# Patient Record
Sex: Female | Born: 1990 | Race: White | Hispanic: Yes | Marital: Single | Smoking: Never smoker
Health system: Southern US, Community
[De-identification: ages and names within clinical notes are randomized; demographics above are authoritative.]

## PROBLEM LIST (undated history)

## (undated) DIAGNOSIS — D649 Anemia, unspecified: Secondary | ICD-10-CM

## (undated) DIAGNOSIS — R0600 Dyspnea, unspecified: Secondary | ICD-10-CM

## (undated) DIAGNOSIS — R519 Headache, unspecified: Secondary | ICD-10-CM

## (undated) DIAGNOSIS — Z87442 Personal history of urinary calculi: Secondary | ICD-10-CM

## (undated) DIAGNOSIS — K37 Unspecified appendicitis: Secondary | ICD-10-CM

## (undated) DIAGNOSIS — H9190 Unspecified hearing loss, unspecified ear: Secondary | ICD-10-CM

## (undated) DIAGNOSIS — R51 Headache: Secondary | ICD-10-CM

## (undated) DIAGNOSIS — K219 Gastro-esophageal reflux disease without esophagitis: Secondary | ICD-10-CM

## (undated) DIAGNOSIS — J988 Other specified respiratory disorders: Secondary | ICD-10-CM

## (undated) DIAGNOSIS — E669 Obesity, unspecified: Secondary | ICD-10-CM

## (undated) HISTORY — PX: TONSILLECTOMY: SUR1361

## (undated) HISTORY — PX: LUNG BIOPSY: SHX232

## (undated) HISTORY — PX: TRACHEOSTOMY: SUR1362

## (undated) HISTORY — PX: TONSILLECTOMY AND ADENOIDECTOMY: SUR1326

## (undated) HISTORY — DX: Other specified respiratory disorders: J98.8

## (undated) HISTORY — DX: Unspecified hearing loss, unspecified ear: H91.90

## (undated) HISTORY — PX: APPENDECTOMY: SHX54

## (undated) HISTORY — PX: CHOLECYSTECTOMY: SHX55

---
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
## 2015-02-25 DIAGNOSIS — J45909 Unspecified asthma, uncomplicated: Secondary | ICD-10-CM | POA: Insufficient documentation

## 2016-01-09 ENCOUNTER — Emergency Department (HOSPITAL_COMMUNITY)
Admission: EM | Admit: 2016-01-09 | Discharge: 2016-01-10 | Disposition: A | Discharge status: Home / Self Care | Payer: 59 | Attending: Emergency Medicine | Admitting: Emergency Medicine

## 2016-01-09 ENCOUNTER — Encounter (HOSPITAL_COMMUNITY): Payer: Self-pay | Admitting: Emergency Medicine

## 2016-01-09 DIAGNOSIS — N200 Calculus of kidney: Secondary | ICD-10-CM | POA: Diagnosis not present

## 2016-01-09 DIAGNOSIS — R109 Unspecified abdominal pain: Secondary | ICD-10-CM | POA: Diagnosis present

## 2016-01-09 HISTORY — DX: Obesity, unspecified: E66.9

## 2016-01-09 HISTORY — DX: Unspecified hearing loss, unspecified ear: H91.90

## 2016-01-09 HISTORY — DX: Anemia, unspecified: D64.9

## 2016-01-09 HISTORY — DX: Gastro-esophageal reflux disease without esophagitis: K21.9

## 2016-01-09 HISTORY — DX: Unspecified appendicitis: K37

## 2016-01-09 LAB — CBC
HCT: 31.8 % — ABNORMAL LOW (ref 36.0–46.0)
HEMOGLOBIN: 9.5 g/dL — AB (ref 12.0–15.0)
MCH: 17 pg — AB (ref 26.0–34.0)
MCHC: 29.9 g/dL — ABNORMAL LOW (ref 30.0–36.0)
MCV: 57 fL — AB (ref 78.0–100.0)
PLATELETS: 345 10*3/uL (ref 150–400)
RBC: 5.58 MIL/uL — AB (ref 3.87–5.11)
RDW: 21.7 % — ABNORMAL HIGH (ref 11.5–15.5)
WBC: 12.3 10*3/uL — ABNORMAL HIGH (ref 4.0–10.5)

## 2016-01-09 LAB — COMPREHENSIVE METABOLIC PANEL
ALBUMIN: 4.1 g/dL (ref 3.5–5.0)
ALT: 19 U/L (ref 14–54)
AST: 27 U/L (ref 15–41)
Alkaline Phosphatase: 83 U/L (ref 38–126)
Anion gap: 7 (ref 5–15)
BUN: 10 mg/dL (ref 6–20)
CHLORIDE: 110 mmol/L (ref 101–111)
CO2: 21 mmol/L — AB (ref 22–32)
CREATININE: 0.7 mg/dL (ref 0.44–1.00)
Calcium: 9.2 mg/dL (ref 8.9–10.3)
GFR calc non Af Amer: >60 mL/min (ref >60)
Glucose, Bld: 110 mg/dL — ABNORMAL HIGH (ref 65–99)
Potassium: 3.4 mmol/L — ABNORMAL LOW (ref 3.5–5.1)
SODIUM: 138 mmol/L (ref 135–145)
Total Bilirubin: 0.4 mg/dL (ref 0.3–1.2)
Total Protein: 7.8 g/dL (ref 6.5–8.1)

## 2016-01-09 LAB — URINE MICROSCOPIC-ADD ON

## 2016-01-09 LAB — URINALYSIS, ROUTINE W REFLEX MICROSCOPIC
GLUCOSE, UA: NEGATIVE mg/dL
Ketones, ur: 15 mg/dL — AB
Nitrite: NEGATIVE
Protein, ur: 100 mg/dL — AB
SPECIFIC GRAVITY, URINE: 1.035 — AB (ref 1.005–1.030)
pH: 5 (ref 5.0–8.0)

## 2016-01-09 LAB — LIPASE, BLOOD: LIPASE: 19 U/L (ref 11–51)

## 2016-01-09 LAB — I-STAT BETA HCG BLOOD, ED (MC, WL, AP ONLY): I-stat hCG, quantitative: <5 m[IU]/mL (ref <5)

## 2016-01-09 MED ORDER — ONDANSETRON HCL 4 MG/2ML IJ SOLN
4.0000 mg | Freq: Once | INTRAMUSCULAR | Status: AC
  Administered 2016-01-09: 4 mg via INTRAVENOUS
  Filled 2016-01-09: qty 2

## 2016-01-09 MED ORDER — SODIUM CHLORIDE 0.9 % IV BOLUS (SEPSIS)
1000.0000 mL | Freq: Once | INTRAVENOUS | Status: AC
  Administered 2016-01-09: 1000 mL via INTRAVENOUS

## 2016-01-09 MED ORDER — KETOROLAC TROMETHAMINE 30 MG/ML IJ SOLN
30.0000 mg | Freq: Once | INTRAMUSCULAR | Status: AC
  Administered 2016-01-09: 30 mg via INTRAVENOUS
  Filled 2016-01-09: qty 1

## 2016-01-09 NOTE — ED Provider Notes (Signed)
[facility] DEPT Provider Note   CSN: [id number] Arrival date & time: 01/09/16  1905     History   Chief Complaint Chief Complaint  Patient presents with  . Abdominal Pain    HPI Karen Mcdaniel is a 25 y.o. female presenting with right-sided abdominal pain. Started around noon today. Is a sharp, constant pain. No back pain. No radiation of the pain. No urinary symptoms. Has vomited 3 or 4 times and had 4 loose bowel movements. No blood in either. She's not sure if the bowel movements are diarrhea or not. No fevers during this time. Took Tylenol but vomited back up. No vaginal bleeding or discharge.  HPI  Past Medical History:  Diagnosis Date  . Anemia   . Appendicitis   . GERD (gastroesophageal reflux disease)   . Hard of hearing   . Obesity     There are no active problems to display for this patient.   Past Surgical History:  Procedure Laterality Date  . APPENDECTOMY    . CHOLECYSTECTOMY    . LUNG BIOPSY    . TONSILLECTOMY      OB History    No data available       Home Medications    Prior to Admission medications   Medication Sig Start Date End Date Taking? Authorizing Provider  Budesonide (PULMICORT FLEXHALER) 90 MCG/ACT inhaler Inhale 1 puff into the lungs 2 (two) times daily as needed (shortness of breath).   Yes Historical Provider, MD  docusate sodium (COLACE) 100 MG capsule Take 100 mg by mouth 2 (two) times daily.   Yes Historical Provider, MD  ferrous sulfate 325 (65 FE) MG tablet Take 325 mg by mouth 2 (two) times daily with a meal.   Yes Historical Provider, MD  omeprazole (PRILOSEC) 40 MG capsule Take 40 mg by mouth 2 (two) times daily.   Yes Historical Provider, MD    Family History No family history on file.  Social History Social History  Substance Use Topics  . Smoking status: Never Smoker  . Smokeless tobacco: Never Used  . Alcohol use No     Allergies   Review of patient's allergies indicates no known allergies.   Review  of Systems Review of Systems  Constitutional: Negative for fever.  Gastrointestinal: Positive for abdominal pain, diarrhea, nausea and vomiting. Negative for blood in stool.  Genitourinary: Negative for dysuria, hematuria, menstrual problem, vaginal bleeding and vaginal discharge.  Musculoskeletal: Negative for back pain.  All other systems reviewed and are negative.    Physical Exam Updated Vital Signs BP 124/78   Pulse 89   Temp 98.2 F (36.8 C) (Oral)   Resp 18   Ht 4\' 10"  (1.473 m)   Wt 182 lb (82.6 kg)   LMP 12/15/2015 (Approximate)   SpO2 100%   BMI 38.04 kg/m   Physical Exam  Constitutional: She is oriented to person, place, and time. She appears well-developed and well-nourished.  HENT:  Head: Normocephalic and atraumatic.  Right Ear: External ear normal.  Left Ear: External ear normal.  Nose: Nose normal.  Eyes: Right eye exhibits no discharge. Left eye exhibits no discharge.  Cardiovascular: Normal rate, regular rhythm and normal heart sounds.   Pulmonary/Chest: Effort normal and breath sounds normal.  Abdominal: Soft. There is no tenderness. There is no CVA tenderness.  Neurological: She is alert and oriented to person, place, and time.  Skin: Skin is warm and dry.  Nursing note and vitals reviewed.    ED Treatments /  Results  Labs (all labs ordered are listed, but only abnormal results are displayed) Labs Reviewed  COMPREHENSIVE METABOLIC PANEL - Abnormal; Notable for the following:       Result Value   Potassium 3.4 (*)    CO2 21 (*)    Glucose, Bld 110 (*)    All other components within normal limits  CBC - Abnormal; Notable for the following:    WBC 12.3 (*)    RBC 5.58 (*)    Hemoglobin 9.5 (*)    HCT 31.8 (*)    MCV 57.0 (*)    MCH 17.0 (*)    MCHC 29.9 (*)    RDW 21.7 (*)    All other components within normal limits  URINALYSIS, ROUTINE W REFLEX MICROSCOPIC (NOT AT [facility]) - Abnormal; Notable for the following:    Color, Urine AMBER (*)      APPearance TURBID (*)    Specific Gravity, Urine 1.035 (*)    Hgb urine dipstick LARGE (*)    Bilirubin Urine MODERATE (*)    Ketones, ur 15 (*)    Protein, ur 100 (*)    Leukocytes, UA SMALL (*)    All other components within normal limits  URINE MICROSCOPIC-ADD ON - Abnormal; Notable for the following:    Squamous Epithelial / LPF 6-30 (*)    Bacteria, UA FEW (*)    Crystals CA OXALATE CRYSTALS (*)    All other components within normal limits  LIPASE, BLOOD  I-STAT BETA HCG BLOOD, ED ([facility], [facility], [facility] ONLY)    EKG  EKG Interpretation None       Radiology No results found.  Procedures Procedures (including critical care time)  Medications Ordered in ED Medications  sodium chloride 0.9 % bolus 1,000 mL (1,000 mLs Intravenous New Bag/Given 01/09/16 2231)  ketorolac (TORADOL) 30 MG/ML injection 30 mg (30 mg Intravenous Given 01/09/16 2233)  ondansetron (ZOFRAN) injection 4 mg (4 mg Intravenous Given 01/09/16 2232)     Initial Impression / Assessment and Plan / ED Course  I have reviewed the triage vital signs and the nursing notes.  Pertinent labs & imaging results that were available during my care of the patient were reviewed by me and considered in my medical decision making (see chart for details).  Clinical Course  Comment By Time  No abd tenderness. Will get CT stone. No appendix Pricilla LovelessScott Danyelle Brookover, MD 09/04 2221    Mostly likely a stone. CT still pending, care transferred to Dr. Elesa MassedWard.  Final Clinical Impressions(s) / ED Diagnoses   Final diagnoses:  None    New Prescriptions New Prescriptions   No medications on file     Pricilla LovelessScott Jahquez Steffler, MD 01/10/16 [phone]

## 2016-01-09 NOTE — ED Triage Notes (Signed)
Pt. reports right abdominal pain with multiple emesis onset today , no emesis at arrival , denies diarrhea or fever.

## 2016-01-10 ENCOUNTER — Encounter (HOSPITAL_COMMUNITY): Payer: Self-pay | Admitting: Radiology

## 2016-01-10 ENCOUNTER — Emergency Department (HOSPITAL_COMMUNITY): Payer: 59

## 2016-01-10 MED ORDER — ONDANSETRON 4 MG PO TBDP: 4.0000 mg | ORAL_TABLET | Freq: Three times a day (TID) | ORAL | 0 refills | Status: DC | PRN

## 2016-01-10 MED ORDER — FENTANYL CITRATE (PF) 100 MCG/2ML IJ SOLN
50.0000 ug | Freq: Once | INTRAMUSCULAR | Status: AC
  Administered 2016-01-10: 50 ug via INTRAVENOUS
  Filled 2016-01-10: qty 2

## 2016-01-10 MED ORDER — IBUPROFEN 800 MG PO TABS: 800.0000 mg | ORAL_TABLET | Freq: Three times a day (TID) | ORAL | 0 refills | Status: DC | PRN

## 2016-01-10 MED ORDER — ONDANSETRON HCL 4 MG/2ML IJ SOLN
4.0000 mg | Freq: Once | INTRAMUSCULAR | Status: AC
  Administered 2016-01-10: 4 mg via INTRAVENOUS
  Filled 2016-01-10: qty 2

## 2016-01-10 MED ORDER — TAMSULOSIN HCL 0.4 MG PO CAPS: 0.4000 mg | ORAL_CAPSULE | Freq: Every day | ORAL | 0 refills | Status: DC

## 2016-01-10 MED ORDER — OXYCODONE-ACETAMINOPHEN 5-325 MG PO TABS: 1.0000 | ORAL_TABLET | Freq: Four times a day (QID) | ORAL | 0 refills | Status: DC | PRN

## 2016-01-10 NOTE — ED Notes (Signed)
Pt stable, ambulatory, states understanding of discharge instructions 

## 2016-01-10 NOTE — ED Provider Notes (Signed)
2:00 AM  Assumed care from Dr. Criss AlvineGoldston.  Pt is a 25 y.o. female who presented to the emergency department right-sided abdominal pain, vomiting. Urine showed blood, calcium oxide crystals. CT of her abdomen and pelvis to rule out stone pending. CT is now back and she has a 3 mm stone at the [facility] without significant hydronephrosis. Urine shows no sign of infection. Creatinine is normal. Pain is improved with Toradol. She still having some mild pain. We'll give fentanyl, Zofran. We'll discharge with urine strainer. She states she is from [place] and plans to go back next week. Have recommended she follow-up with urologist in [place]. We'll discharge with Percocet, ibuprofen, Zofran, Flomax. Discussed return precautions. She is comfortable with this plan.   Layla MawKristen N Amesha Bailey, DO 01/10/16 0221

## 2016-05-03 ENCOUNTER — Emergency Department (HOSPITAL_COMMUNITY): Payer: Managed Care, Other (non HMO)

## 2016-05-03 ENCOUNTER — Emergency Department (HOSPITAL_COMMUNITY)
Admission: EM | Admit: 2016-05-03 | Discharge: 2016-05-03 | Disposition: A | Discharge status: Home / Self Care | Payer: Managed Care, Other (non HMO) | Attending: Emergency Medicine | Admitting: Emergency Medicine

## 2016-05-03 ENCOUNTER — Encounter (HOSPITAL_COMMUNITY): Payer: Self-pay

## 2016-05-03 DIAGNOSIS — Z79899 Other long term (current) drug therapy: Secondary | ICD-10-CM | POA: Insufficient documentation

## 2016-05-03 DIAGNOSIS — J386 Stenosis of larynx: Secondary | ICD-10-CM | POA: Diagnosis not present

## 2016-05-03 DIAGNOSIS — R061 Stridor: Secondary | ICD-10-CM

## 2016-05-03 DIAGNOSIS — R0602 Shortness of breath: Secondary | ICD-10-CM | POA: Diagnosis present

## 2016-05-03 LAB — CBC
HCT: 28.9 % — ABNORMAL LOW (ref 36.0–46.0)
Hemoglobin: 8.6 g/dL — ABNORMAL LOW (ref 12.0–15.0)
MCH: 16.1 pg — ABNORMAL LOW (ref 26.0–34.0)
MCHC: 29.8 g/dL — AB (ref 30.0–36.0)
MCV: 54.2 fL — ABNORMAL LOW (ref 78.0–100.0)
PLATELETS: 221 10*3/uL (ref 150–400)
RBC: 5.33 MIL/uL — ABNORMAL HIGH (ref 3.87–5.11)
RDW: 20.3 % — AB (ref 11.5–15.5)
WBC: 7.6 10*3/uL (ref 4.0–10.5)

## 2016-05-03 LAB — BASIC METABOLIC PANEL
ANION GAP: 7 (ref 5–15)
BUN: 8 mg/dL (ref 6–20)
CHLORIDE: 107 mmol/L (ref 101–111)
CO2: 21 mmol/L — AB (ref 22–32)
CREATININE: 0.6 mg/dL (ref 0.44–1.00)
Calcium: 8.9 mg/dL (ref 8.9–10.3)
GFR calc non Af Amer: >60 mL/min (ref >60)
Glucose, Bld: 90 mg/dL (ref 65–99)
POTASSIUM: 3.6 mmol/L (ref 3.5–5.1)
Sodium: 135 mmol/L (ref 135–145)

## 2016-05-03 LAB — I-STAT TROPONIN, ED: TROPONIN I, POC: 0 ng/mL (ref 0.00–0.08)

## 2016-05-03 MED ORDER — DEXAMETHASONE SODIUM PHOSPHATE 10 MG/ML IJ SOLN
10.0000 mg | Freq: Once | INTRAMUSCULAR | Status: AC
  Administered 2016-05-03: 10 mg via INTRAVENOUS
  Filled 2016-05-03: qty 1

## 2016-05-03 MED ORDER — RACEPINEPHRINE HCL 2.25 % IN NEBU
0.5000 mL | INHALATION_SOLUTION | Freq: Once | RESPIRATORY_TRACT | Status: AC
  Administered 2016-05-03: 0.5 mL via RESPIRATORY_TRACT
  Filled 2016-05-03: qty 0.5

## 2016-05-03 MED ORDER — ALBUTEROL SULFATE (2.5 MG/3ML) 0.083% IN NEBU
INHALATION_SOLUTION | RESPIRATORY_TRACT | Status: AC
  Filled 2016-05-03: qty 3

## 2016-05-03 MED ORDER — ALBUTEROL SULFATE (2.5 MG/3ML) 0.083% IN NEBU
5.0000 mg | INHALATION_SOLUTION | Freq: Once | RESPIRATORY_TRACT | Status: AC
  Administered 2016-05-03: 5 mg via RESPIRATORY_TRACT
  Administered 2016-05-03: 2.5 mg via RESPIRATORY_TRACT

## 2016-05-03 MED ORDER — PREDNISONE 20 MG PO TABS: ORAL_TABLET | ORAL | 0 refills | Status: DC

## 2016-05-03 MED ORDER — ALBUTEROL SULFATE (2.5 MG/3ML) 0.083% IN NEBU: 5.0000 mg | INHALATION_SOLUTION | Freq: Once | RESPIRATORY_TRACT | Status: DC

## 2016-05-03 MED ORDER — ALBUTEROL SULFATE (2.5 MG/3ML) 0.083% IN NEBU
INHALATION_SOLUTION | RESPIRATORY_TRACT | Status: AC
  Administered 2016-05-03: 2.5 mg via RESPIRATORY_TRACT
  Filled 2016-05-03: qty 6

## 2016-05-03 NOTE — ED Notes (Signed)
Pt with audible wheeze, stats 100%

## 2016-05-03 NOTE — ED Triage Notes (Signed)
Pt states that for the past three days she has been having SOB, pt with audible wheezing, states she has had to have her airway dilated twice. Also having CP on the L side

## 2016-05-03 NOTE — ED Provider Notes (Signed)
[facility] Provider Note   CSN: [id number] Arrival date & time: 05/03/16  0330     History   Chief Complaint Chief Complaint  Patient presents with  . Shortness of Breath  . Chest Pain    HPI Karen Mcdaniel is a 25 y.o. female.  Patient presents to the emergency part with complaints of shortness of breath. Patient has a long history of laryngeal stenosis, previously treated in [place] by an ENT specialist. Patient reports intermittent shortness of breath, loss of voice requiring intervention. 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 Patient reports that for the last 3 days she has had progressively worsening shortness of breath similar to previous problems.      Past Medical History:  Diagnosis Date  . Anemia   . Appendicitis   . GERD (gastroesophageal reflux disease)   . Hard of hearing   . Obesity     There are no active problems to display for this patient.   Past Surgical History:  Procedure Laterality Date  . APPENDECTOMY    . CHOLECYSTECTOMY    . LUNG BIOPSY    . TONSILLECTOMY      OB History    No data available       Home Medications    Prior to Admission medications   Medication Sig Start Date End Date Taking? Authorizing Provider  Budesonide (PULMICORT FLEXHALER) 90 MCG/ACT inhaler Inhale 1 puff into the lungs 2 (two) times daily as needed (shortness of breath).   Yes Historical Provider, MD  docusate sodium (COLACE) 100 MG capsule Take 100 mg by mouth 2 (two) times daily.   Yes Historical Provider, MD  ferrous sulfate 325 (65 FE) MG tablet Take 325 mg by mouth 2 (two) times daily with a meal.   Yes Historical Provider, MD  omeprazole (PRILOSEC) 40 MG capsule Take 40 mg by mouth 2 (two) times daily.   Yes Historical Provider, MD  ibuprofen (ADVIL,MOTRIN) 800 MG tablet Take 1 tablet (800 mg total) by mouth every 8 (eight) hours as needed for mild pain. Patient not taking:  Reported on 05/03/2016 01/10/16   Kristen N Ward, DO  ondansetron (ZOFRAN ODT) 4 MG disintegrating tablet Take 1 tablet (4 mg total) by mouth every 8 (eight) hours as needed for nausea or vomiting. Patient not taking: Reported on 05/03/2016 01/10/16   Layla MawKristen N Ward, DO  oxyCODONE-acetaminophen (PERCOCET/ROXICET) 5-325 MG tablet Take 1-2 tablets by mouth every 6 (six) hours as needed. Patient not taking: Reported on 05/03/2016 01/10/16   Layla MawKristen N Ward, DO  tamsulosin (FLOMAX) 0.4 MG CAPS capsule Take 1 capsule (0.4 mg total) by mouth daily. Take until kidney stone has passed Patient not taking: Reported on 05/03/2016 01/10/16   Layla MawKristen N Ward, DO    Family History No family history on file.  Social History Social History  Substance Use Topics  . Smoking status: Never Smoker  . Smokeless tobacco: Never Used  . Alcohol use No     Allergies   Patient has no known allergies.   Review of Systems Review of Systems  HENT: Positive for voice change.   Respiratory: Positive for shortness of breath.   All other systems reviewed and are negative.    Physical Exam Updated Vital Signs BP 121/83 (BP Location: Right Arm)   Pulse 120   Temp 98.2 F (36.8 C) (Oral)   Resp 14   Ht 4\' 10"  (1.473 m)  Wt 183 lb (83 kg)   LMP 05/02/2016   SpO2 99%   BMI 38.25 kg/m   Physical Exam  Constitutional: She is oriented to person, place, and time. She appears well-developed and well-nourished. No distress.  HENT:  Head: Normocephalic and atraumatic.  Right Ear: Hearing normal.  Left Ear: Hearing normal.  Nose: Nose normal.  Mouth/Throat: Oropharynx is clear and moist and mucous membranes are normal.  Eyes: Conjunctivae and EOM are normal. Pupils are equal, round, and reactive to light.  Neck: Normal range of motion. Neck supple.  Cardiovascular: Regular rhythm, S1 normal and S2 normal.  Exam reveals no gallop and no friction rub.   No murmur heard. Pulmonary/Chest: Effort normal and breath  sounds normal. Stridor present. No respiratory distress. She exhibits no tenderness.  Abdominal: Soft. Normal appearance and bowel sounds are normal. There is no hepatosplenomegaly. There is no tenderness. There is no rebound, no guarding, no tenderness at McBurney's point and negative Murphy's sign. No hernia.  Musculoskeletal: Normal range of motion.  Neurological: She is alert and oriented to person, place, and time. She has normal strength. No cranial nerve deficit or sensory deficit. Coordination normal. GCS eye subscore is 4. GCS verbal subscore is 5. GCS motor subscore is 6.  Skin: Skin is warm, dry and intact. No rash noted. No cyanosis.  Psychiatric: She has a normal mood and affect. Her speech is normal and behavior is normal. Thought content normal.  Nursing note and vitals reviewed.    ED Treatments / Results  Labs (all labs ordered are listed, but only abnormal results are displayed) Labs Reviewed  BASIC METABOLIC PANEL - Abnormal; Notable for the following:       Result Value   CO2 21 (*)    All other components within normal limits  CBC - Abnormal; Notable for the following:    RBC 5.33 (*)    Hemoglobin 8.6 (*)    HCT 28.9 (*)    MCV 54.2 (*)    MCH 16.1 (*)    MCHC 29.8 (*)    RDW 20.3 (*)    All other components within normal limits  I-STAT TROPOININ, ED    EKG  EKG Interpretation None       Radiology Dg Neck Soft Tissue  Result Date: 05/03/2016 CLINICAL DATA:  Cough, shortness of breath and stridor EXAM: NECK SOFT TISSUES - 1+ VIEW COMPARISON:  None. FINDINGS: There is loss of the normal epiglottic silhouette, which may be secondary to enlargement of the lingual tonsils. There is no airway compromise. No prevertebral soft tissue swelling. IMPRESSION: No airway compromise. Loss of normal epiglottic silhouette is favored to be due to enlarged lingual tonsils, however, epiglottic edema would be difficult to exclude. CT could be performed for more definitive  characterization if clinically indicated. Electronically Signed   By: Deatra Robinson M.D.   On: 05/03/2016 05:58   Dg Chest 2 View  Result Date: 05/03/2016 CLINICAL DATA:  Cough and shortness of breath EXAM: CHEST  2 VIEW COMPARISON:  CT abdomen pelvis 01/10/2016 FINDINGS: There is suture material in the right lung base, reflecting prior biopsy. No focal airspace consolidation or pulmonary edema. Cardiomediastinal contours are normal. No pleural effusion or pneumothorax. IMPRESSION: 1. No active cardiopulmonary disease. 2. Scarring and suture material in the right middle lobe at site of prior biopsy. Electronically Signed   By: Deatra Robinson M.D.   On: 05/03/2016 05:56    Procedures Procedures (including critical care time)  Medications Ordered  in ED Medications  albuterol (PROVENTIL) (2.5 MG/3ML) 0.083% nebulizer solution 5 mg (not administered)  albuterol (PROVENTIL) (2.5 MG/3ML) 0.083% nebulizer solution 5 mg (2.5 mg Nebulization Given 05/03/16 0409)  dexamethasone (DECADRON) injection 10 mg (10 mg Intravenous Given 05/03/16 0426)  Racepinephrine HCl 2.25 % nebulizer solution 0.5 mL (0.5 mLs Nebulization Given 05/03/16 0426)     Initial Impression / Assessment and Plan / ED Course  I have reviewed the triage vital signs and the nursing notes.  Pertinent labs & imaging results that were available during my care of the patient were reviewed by me and considered in my medical decision making (see chart for details).  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 She reports having dilatation in December of last year that did not improve her symptoms of voice loss and shortness of breath. She had a repeat procedure in April which included CO2 laser removal of a colonic lab which improved her significantly. She now reports, however, in the last 3 days she has had increased worse voice and progressively worsening  shortness of breath.  Arrival, patient was exhibiting stridor and some increased work of breathing. This has resolved after racemic epinephrine and Decadron. She has been monitored for 2 hours and continues to do well without any recurrence of stridor. Discussed briefly with Dr. Lazarus SalinesWolicki. He can see the patient in the office at 9 AM. Patient continues to do well and is stable for discharge and outpatient follow-up this morning.  Final Clinical Impressions(s) / ED Diagnoses   Final diagnoses:  Stridor  Laryngeal stenosis    New Prescriptions New Prescriptions   No medications on file     Gilda Creasehristopher J Pollina, MD 05/03/16 [phone]

## 2016-05-03 NOTE — Discharge Instructions (Signed)
Go to Dr. Raye SorrowWolicki's office at [facility] for repeat evaluation and further treatment. Call 911 for any recurrent episodes of shortness of breath.

## 2016-06-26 ENCOUNTER — Ambulatory Visit: Payer: Self-pay | Admitting: Otolaryngology

## 2016-06-26 NOTE — H&P (Signed)
HPI:   Karen Mcdaniel is a 26 y.o. female patient of Patient Does Not Have Pcp for evaluation of Hoarseness and breathing difficulty. 26 year old Hispanic female recently moved down from the [place] area. She was diagnosed with some sort of stenosis at the age of 55. She was seen at [facility] at the age of 39 but no therapy at that time. By the time she was 19, received dilation. She had a possible dilatation in December 2016 which helped very little. She had [employer] laryngoscopy with laser lysis of anterior glottic stenosis in May 2017. She was improved for a while but has been having worsening hoarseness for the past 3-4 months, and breathing difficulty for the last 3 or 4 days. She may have an upper respiratory infection but no facial pain or nasal drainage. I reviewed the operative report from May 2017 describing a two thirds fusion of the anterior vocal cords. Upon releasing the fusion, no subglottic or tracheal stenosis was noted. She was seen in the emergency room this morning and was improved after a course of racemic epinephrine, and Decadron. She is here for further consideration. No swallowing issues. Over the years, she has had ear surgery, gallbladder removal, appendectomy, open lung biopsy all apparently under successful general anesthesia. She has been on Prilosec once or twice daily for years.  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 PMH/Meds/All/SocHx/FamHx/ROS:   Past Medical History:  Diagnosis Date  . Acid reflux  . Hearing loss   Past Surgical History:  Procedure Laterality Date  . GALLBLADDER SURGERY   No family history of bleeding disorders, wound healing problems or difficulty with anesthesia.   Social History   Social History  . Marital status: Single  Spouse name: N/A  . Number of children: N/A  . Years of education: N/A   Occupational History  . Not on file.    Social History Main Topics  . Smoking status: Never Smoker  . Smokeless tobacco: Never Used  . Alcohol use Yes  . Drug use: Unknown  . Sexual activity: Not on file   Other Topics Concern  . Not on file   Social History Narrative  . No narrative on file   Current Outpatient Prescriptions:  . BUDESONIDE (PULMICORT INHL), Inhale into the lungs., Disp: , Rfl:  . docusate sodium (COLACE) 100 MG capsule, Take 100 mg by mouth 2 times daily., Disp: , Rfl:  . FERROUS SULFATE (IRON ORAL), Take by mouth., Disp: , Rfl:  . omeprazole (PRILOSEC) 40 MG capsule, Take 40 mg by mouth daily., Disp: , Rfl:  . amoxicillin (AMOXIL) 500 MG capsule, Take 1 capsule (500 mg total) by mouth 3 times daily., Disp: 30 capsule, Rfl: 0  A complete ROS was performed with pertinent positives/negatives noted in the HPI. The remainder of the ROS are negative.   Physical Exam:   Ht 1.473 m (4\' 10" )  Wt 83 kg (183 lb)  BMI 38.25 kg/m  She is petite. Her voice is aphonic. She has some inspiratory stridor. Mental status is appropriate. She hears reasonably in conversational speech with a hearing aid on the right side. The head is atraumatic and neck supple. Cranial nerves intact. Ear canals are clear. Both drums are scarred and retracted with possible fluid. Anterior nose shows a mild rightward septal deviation with healthy mucosa. Oral cavity is clear with teeth in good repair. Oropharynx clear. Neck without adenopathy. No thyromegaly. I could not get an  adequate look at the larynx with a mirror examination.  Using the flexible laryngoscope, she has a strong gag reflex. There is some greenish mucoid material in both sides of the nose and the nasopharynx. On brief inspection of the larynx, the vocal cords appear to be fused approximately halfway from the anterior commissure back. The airway looks okay. There is some posterior laryngeal erythema and swelling.  Flexible Laryngoscopy  Indications were discussed. Details  of the procedure were explained. Questions were answered and informed consent was obtained verbally.   Technique: After anesthetizing the nasal cavity with topical lidocaine and oxymetazoline, the flexible endoscope was introduced and passed through the right nasal cavity into the nasopharynx. The nasopharynx was inspected with normal eustachian tori. Oropharynx normal with minimal lingual tonsils and no lesions. Hypopharynx/Larynx shows mobile vocal cords with good airway. No pooling in valleculae or pyriforms. The scope was withdrawn from the nose. She tolerated the procedure well.   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 Recent upper respiratory infection/possible sinusitis as an acute insult causing deterioration.  Plan: I gave her nasal hygiene instructions and a prescription for amoxicillin. I will see her back in 2 weeks. She may once again need lysis of the anterior cords for more definitive treatment, she may need a laryngotomy with placement of a keel which would likely require a tracheostomy.

## 2016-06-28 NOTE — Pre-Procedure Instructions (Signed)
Ryenne Mowrer  06/28/2016      [id number]- [place], [place] - [address] [facility] [address] [facility] [address] [id number] [place] [id number] Phone: [phone] Fax: [phone]  [facility] [address] - [place], [place] - [id number] [place] [address] Phone: [phone] Fax: [phone]    Your procedure is scheduled on Wednesday, February 28.  [facility] Ad[facility] at 8:30 AM               Your surgery or procedure is scheduled for 10:30 AM   Call this number if you have problems the morning of surgery:[phone]       For any other questions, please call [phone], Monday - Friday 8 AM - 4 PM.   Remember:  Do not eat food or drink liquids after midnight Tuesday, February 27.  Take these medicines the morning of surgery with A SIP OF WATER : omeprazole (PRILOSEC).  May use Pulmicort.                [facility]- Preparing For Surgery  Before surgery, you can play an important role. Because skin is not sterile, your skin needs to be as free of germs as possible. You can reduce the number of germs on your skin by washing with CHG (chlorahexidine gluconate) Soap before surgery.  CHG is an antiseptic cleaner which kills germs and bonds with the skin to continue killing germs even after washing.  Please do not use if you have an allergy to CHG or antibacterial soaps. If your skin becomes reddened/irritated stop using the CHG.  Do not shave (including legs and underarms) for at least 48 hours prior to first CHG shower. It is OK to shave your face.  Please follow these instructions carefully.   1. Shower the NIGHT BEFORE SURGERY and the MORNING OF SURGERY with CHG.   2. If you chose to wash your hair, wash your hair first as usual with your normal shampoo.  3. After you shampoo, rinse your hair and body thoroughly to remove the shampoo.  4. Use CHG as you would any  other liquid soap. You can apply CHG directly to the skin and wash gently with a scrungie or a clean washcloth.   5. Apply the CHG Soap to your body ONLY FROM THE NECK DOWN.  Do not use on open wounds or open sores. Avoid contact with your eyes, ears, mouth and genitals (private parts). Wash genitals (private parts) with your normal soap.  6. Wash thoroughly, paying special attention to the area where your surgery will be performed.  7. Thoroughly rinse your body with warm water from the neck down.  8. DO NOT shower/wash with your normal soap after using and rinsing off the CHG Soap.  9. Pat yourself dry with a CLEAN TOWEL.   10. Wear CLEAN PAJAMAS   11. Place CLEAN SHEETS on your bed the night of your first shower and DO NOT SLEEP WITH PETS.  Day of Surgery: Do not apply any deodorants/lotions. Please wear clean clothes to the hospital/surgery center.    Do not wear jewelry, make-up or nail polish.  Do not wear lotions, powders, or perfumes, or deodorant.  Do not shave 48 hours prior to surgery.  Men may shave face and neck.  Do not bring valuables to the hospital.  [facility] is not responsible for any belongings or  valuables.  Contacts, dentures or bridgework may not be worn into surgery.  Leave your suitcase in the car.  After surgery it may be brought to your room.  For patients admitted to the hospital, discharge time will be determined by your treatment team.  Patients discharged the day of surgery will not be allowed to drive home.   Name and phone number of your driver:   - Special instructions:  -  Please read over the following fact sheets that you were given.

## 2016-06-29 ENCOUNTER — Encounter (HOSPITAL_COMMUNITY)
Admission: RE | Admit: 2016-06-29 | Discharge: 2016-06-29 | Disposition: A | Discharge status: Home / Self Care | Payer: Managed Care, Other (non HMO) | Source: Ambulatory Visit | Attending: Otolaryngology | Admitting: Otolaryngology

## 2016-06-29 ENCOUNTER — Encounter (HOSPITAL_COMMUNITY): Payer: Self-pay

## 2016-06-29 DIAGNOSIS — J386 Stenosis of larynx: Secondary | ICD-10-CM | POA: Insufficient documentation

## 2016-06-29 DIAGNOSIS — Z01812 Encounter for preprocedural laboratory examination: Secondary | ICD-10-CM | POA: Diagnosis not present

## 2016-06-29 HISTORY — DX: Dyspnea, unspecified: R06.00

## 2016-06-29 HISTORY — DX: Headache, unspecified: R51.9

## 2016-06-29 HISTORY — DX: Personal history of urinary calculi: Z87.442

## 2016-06-29 HISTORY — DX: Headache: R51

## 2016-06-29 LAB — CBC
HCT: 32.5 % — ABNORMAL LOW (ref 36.0–46.0)
Hemoglobin: 9.5 g/dL — ABNORMAL LOW (ref 12.0–15.0)
MCH: 16.8 pg — AB (ref 26.0–34.0)
MCHC: 29.2 g/dL — AB (ref 30.0–36.0)
MCV: 57.5 fL — AB (ref 78.0–100.0)
PLATELETS: 294 10*3/uL (ref 150–400)
RBC: 5.65 MIL/uL — AB (ref 3.87–5.11)
RDW: 20.2 % — ABNORMAL HIGH (ref 11.5–15.5)
WBC: 6.9 10*3/uL (ref 4.0–10.5)

## 2016-06-29 LAB — HCG, SERUM, QUALITATIVE: Preg, Serum: NEGATIVE

## 2016-06-29 NOTE — Pre-Procedure Instructions (Signed)
Karen Mcdaniel  06/29/2016      [id number] [place], [place] - [address] [facility] [address] [place] [address] [facility] [address] [place] [address] Phone: [phone] Fax: [phone]  [facility] [address], [place] - [address] [place] [address] Phone: [phone] Fax: [phone]    Your procedure is scheduled on 07-04-2016  Wednesday .  Report to [facility] Admitting at 8:30 A.M.   Call this number if you have problems the morning of surgery:  [phone]   Remember:  Do not eat food or drink liquids after midnight.   Take these medicines the morning of surgery with A SIP OF WATER Pulmicort Inhaler if needed,omeprazole(Prilosec),            STOP ASPIRIN,ANTIINFLAMATORIES (IBUPROFEN,ALEVE,MOTRIN,ADVIL,GOODY'S POWDERS),HERBAL SUPPLEMENTS,FISH OIL,AND VITAMINS 5-7 DAYS PRIOR TO SURGERY   Do not wear jewelry, make-up or nail polish.  Do not wear lotions, powders, or perfumes, or deoderant.  Do not shave 48 hours prior to surgery.  Men may shave face and neck.  Do not bring valuables to the hospital.  [facility] is not responsible for any belongings or valuables.  Contacts, dentures or bridgework may not be worn into surgery.  Leave your suitcase in the car.  After surgery it may be brought to your room.  For patients admitted to the hospital, discharge time will be determined by your treatment team.  Patients discharged the day of surgery will not be allowed to drive home.            Special Instructions: [facility] - Preparing for Surgery  Before surgery, you can play an important role.  Because skin is not sterile, your skin needs to be as free of germs as possible.  You can reduce the number of germs on you skin by washing with CHG (chlorahexidine gluconate) soap before surgery.  CHG is an antiseptic cleaner which kills germs and bonds with the skin to  continue killing germs even after washing.  Please DO NOT use if you have an allergy to CHG or antibacterial soaps.  If your skin becomes reddened/irritated stop using the CHG and inform your nurse when you arrive at Short Stay.  Do not shave (including legs and underarms) for at least 48 hours prior to the first CHG shower.  You may shave your face.  Please follow these instructions carefully:   1.  Shower with CHG Soap the night before surgery and the   morning of Surgery.  2.  If you choose to wash your hair, wash your hair first as usual with your normal shampoo.  3.  After you shampoo, rinse your hair and body thoroughly to remove the  Shampoo.  4.  Use CHG as you would any other liquid soap.  You can apply chg directly  to the skin and wash gently with scrungie or a clean washcloth.  5.  Apply the CHG Soap to your body ONLY FROM THE NECK DOWN.   Do not use on open wounds or open sores.  Avoid contact with your eyes,  ears, mouth and genitals (private parts).  Wash genitals (private parts) with your normal soap.  6.  Wash thoroughly, paying special attention to the area where your surgery will be performed.  7.  Thoroughly rinse your body with warm water from the neck down.  8.  DO NOT shower/wash with your normal  soap after using and rinsing o  the CHG Soap.  9.  Pat yourself dry with a clean towel.            10.  Wear clean pajamas.            11.  Place clean sheets on your bed the night of your first shower and do not sleep with pets.  Day of Surgery  Do not apply any lotions/deodorants the morning of surgery.  Please wear clean clothes to the hospital/surgery center.

## 2016-07-04 ENCOUNTER — Ambulatory Visit (HOSPITAL_COMMUNITY): Payer: Managed Care, Other (non HMO) | Admitting: Certified Registered Nurse Anesthetist

## 2016-07-04 ENCOUNTER — Encounter (HOSPITAL_COMMUNITY): Payer: Self-pay | Admitting: *Deleted

## 2016-07-04 ENCOUNTER — Encounter (HOSPITAL_COMMUNITY)
Admission: RE | Disposition: A | Discharge status: Home / Self Care | Payer: Self-pay | Source: Ambulatory Visit | Attending: Otolaryngology

## 2016-07-04 ENCOUNTER — Ambulatory Visit (HOSPITAL_COMMUNITY)
Admission: RE | Admit: 2016-07-04 | Discharge: 2016-07-04 | Disposition: A | Discharge status: Home / Self Care | Payer: Managed Care, Other (non HMO) | Source: Ambulatory Visit | Attending: Otolaryngology | Admitting: Otolaryngology

## 2016-07-04 DIAGNOSIS — J386 Stenosis of larynx: Secondary | ICD-10-CM | POA: Insufficient documentation

## 2016-07-04 DIAGNOSIS — K219 Gastro-esophageal reflux disease without esophagitis: Secondary | ICD-10-CM | POA: Insufficient documentation

## 2016-07-04 DIAGNOSIS — Q310 Web of larynx: Secondary | ICD-10-CM | POA: Insufficient documentation

## 2016-07-04 DIAGNOSIS — Z6837 Body mass index (BMI) 37.0-37.9, adult: Secondary | ICD-10-CM | POA: Insufficient documentation

## 2016-07-04 DIAGNOSIS — R490 Dysphonia: Secondary | ICD-10-CM | POA: Insufficient documentation

## 2016-07-04 HISTORY — PX: MICROLARYNGOSCOPY WITH LASER AND BALLOON DILATION: SHX5973

## 2016-07-04 SURGERY — MICROLARYNGOSCOPY WITH LASER AND BALLOON DILATION
Anesthesia: General

## 2016-07-04 MED ORDER — ONDANSETRON HCL 4 MG/2ML IJ SOLN
INTRAMUSCULAR | Status: DC | PRN
  Administered 2016-07-04: 4 mg via INTRAVENOUS

## 2016-07-04 MED ORDER — SUGAMMADEX SODIUM 200 MG/2ML IV SOLN
INTRAVENOUS | Status: DC | PRN
  Administered 2016-07-04: 170 mg via INTRAVENOUS

## 2016-07-04 MED ORDER — TRIAMCINOLONE ACETONIDE 40 MG/ML IJ SUSP
INTRAMUSCULAR | Status: DC | PRN
  Administered 2016-07-04: .5 mL

## 2016-07-04 MED ORDER — CEPHALEXIN 500 MG PO CAPS: 500.0000 mg | ORAL_CAPSULE | Freq: Three times a day (TID) | ORAL | 0 refills | Status: DC

## 2016-07-04 MED ORDER — EPINEPHRINE HCL (NASAL) 0.1 % NA SOLN
NASAL | Status: AC
  Filled 2016-07-04: qty 30

## 2016-07-04 MED ORDER — TRIAMCINOLONE ACETONIDE 40 MG/ML IJ SUSP
INTRAMUSCULAR | Status: AC
  Filled 2016-07-04: qty 5

## 2016-07-04 MED ORDER — MIDAZOLAM HCL 2 MG/2ML IJ SOLN
INTRAMUSCULAR | Status: AC
Start: 2016-07-04 — End: 2016-07-04
  Filled 2016-07-04: qty 2

## 2016-07-04 MED ORDER — FENTANYL CITRATE (PF) 100 MCG/2ML IJ SOLN
INTRAMUSCULAR | Status: DC | PRN
  Administered 2016-07-04 (×3): 50 ug via INTRAVENOUS

## 2016-07-04 MED ORDER — PROPOFOL 10 MG/ML IV BOLUS
INTRAVENOUS | Status: AC
  Filled 2016-07-04: qty 20

## 2016-07-04 MED ORDER — PROMETHAZINE HCL 25 MG/ML IJ SOLN
INTRAMUSCULAR | Status: AC
  Filled 2016-07-04: qty 1

## 2016-07-04 MED ORDER — LACTATED RINGERS IV SOLN
INTRAVENOUS | Status: DC
  Administered 2016-07-04 (×2): via INTRAVENOUS

## 2016-07-04 MED ORDER — EPINEPHRINE PF 1 MG/ML IJ SOLN
INTRAMUSCULAR | Status: DC | PRN
  Administered 2016-07-04: 30 mL

## 2016-07-04 MED ORDER — PREDNISONE 10 MG (21) PO TBPK: ORAL_TABLET | ORAL | 0 refills | Status: DC

## 2016-07-04 MED ORDER — ROCURONIUM BROMIDE 100 MG/10ML IV SOLN
INTRAVENOUS | Status: DC | PRN
  Administered 2016-07-04: 40 mg via INTRAVENOUS

## 2016-07-04 MED ORDER — MIDAZOLAM HCL 5 MG/5ML IJ SOLN
INTRAMUSCULAR | Status: DC | PRN
  Administered 2016-07-04: 2 mg via INTRAVENOUS

## 2016-07-04 MED ORDER — FENTANYL CITRATE (PF) 100 MCG/2ML IJ SOLN
INTRAMUSCULAR | Status: AC
  Filled 2016-07-04: qty 2

## 2016-07-04 MED ORDER — SUGAMMADEX SODIUM 200 MG/2ML IV SOLN
INTRAVENOUS | Status: AC
  Filled 2016-07-04: qty 2

## 2016-07-04 MED ORDER — DEXAMETHASONE SODIUM PHOSPHATE 4 MG/ML IJ SOLN
INTRAMUSCULAR | Status: DC | PRN
  Administered 2016-07-04: 10 mg via INTRAVENOUS

## 2016-07-04 MED ORDER — FENTANYL CITRATE (PF) 100 MCG/2ML IJ SOLN
25.0000 ug | INTRAMUSCULAR | Status: DC | PRN
  Administered 2016-07-04 (×2): 50 ug via INTRAVENOUS

## 2016-07-04 MED ORDER — MIDAZOLAM HCL 2 MG/2ML IJ SOLN: 0.5000 mg | Freq: Once | INTRAMUSCULAR | Status: DC | PRN

## 2016-07-04 MED ORDER — MEPERIDINE HCL 25 MG/ML IJ SOLN: 6.2500 mg | INTRAMUSCULAR | Status: DC | PRN

## 2016-07-04 MED ORDER — 0.9 % SODIUM CHLORIDE (POUR BTL) OPTIME: TOPICAL | Status: DC | PRN

## 2016-07-04 MED ORDER — LIDOCAINE HCL (CARDIAC) 20 MG/ML IV SOLN
INTRAVENOUS | Status: DC | PRN
  Administered 2016-07-04: 20 mg via INTRAVENOUS

## 2016-07-04 MED ORDER — PROMETHAZINE HCL 25 MG/ML IJ SOLN
6.2500 mg | INTRAMUSCULAR | Status: DC | PRN
  Administered 2016-07-04: 6.25 mg via INTRAVENOUS

## 2016-07-04 MED ORDER — PROPOFOL 10 MG/ML IV BOLUS
INTRAVENOUS | Status: DC | PRN
Start: 2016-07-04 — End: 2016-07-04
  Administered 2016-07-04: 200 mg via INTRAVENOUS

## 2016-07-04 SURGICAL SUPPLY — 26 items
BALLN PULM 12 13.5 15X75 (BALLOONS) ×2
BALLN PULM 15 16.5 18X75 (BALLOONS)
BALLN PULMONARY 10-12 (MISCELLANEOUS) IMPLANT
BALLN PULMONARY 8-10 OD75 (MISCELLANEOUS) IMPLANT
BALLOON PULM 12 13.5 15X75 (BALLOONS) ×1 IMPLANT
BALLOON PULM 15 16.5 18X75 (BALLOONS) IMPLANT
CANISTER SUCTION 1200CC (MISCELLANEOUS) ×2 IMPLANT
COVER BACK TABLE 60X90IN (DRAPES) ×2 IMPLANT
COVER MAYO STAND STRL (DRAPES) ×2 IMPLANT
DRAPE PROXIMA HALF (DRAPES) ×2 IMPLANT
GAUZE SPONGE 4X4 16PLY XRAY LF (GAUZE/BANDAGES/DRESSINGS) ×2 IMPLANT
GLOVE ECLIPSE 7.5 STRL STRAW (GLOVE) ×2 IMPLANT
GOWN STRL REUS W/ TWL LRG LVL3 (GOWN DISPOSABLE) ×2 IMPLANT
GOWN STRL REUS W/TWL LRG LVL3 (GOWN DISPOSABLE) ×2
GUARD TEETH (MISCELLANEOUS) ×2 IMPLANT
KIT BASIN OR (CUSTOM PROCEDURE TRAY) ×2 IMPLANT
NEEDLE 18GX1X1/2 (RX/OR ONLY) (NEEDLE) ×2 IMPLANT
NS IRRIG 1000ML POUR BTL (IV SOLUTION) ×2 IMPLANT
PAD EYE OVAL STERILE LF (GAUZE/BANDAGES/DRESSINGS) ×4 IMPLANT
PATTIES SURGICAL .5 X3 (DISPOSABLE) ×2 IMPLANT
SOLUTION ANTI FOG 6CC (MISCELLANEOUS) ×2 IMPLANT
SURGILUBE 2OZ TUBE FLIPTOP (MISCELLANEOUS) ×2 IMPLANT
SYR 50ML LL SCALE MARK (SYRINGE) ×2 IMPLANT
SYR CONTROL 10ML LL (SYRINGE) ×2 IMPLANT
TOWEL OR 17X24 6PK STRL BLUE (TOWEL DISPOSABLE) ×2 IMPLANT
TUBE CONNECTING 12X1/4 (SUCTIONS) ×2 IMPLANT

## 2016-07-04 NOTE — Transfer of Care (Deleted)
Immediate Anesthesia Transfer of Care Note  Patient: Karen Mcdaniel  Procedure(s) Performed: Procedure(s): MICROLARYNGOSCOPY WITH LASER AND BALLOON DILATION (N/A)  Patient Location: PACU  Anesthesia Type:General  Level of Consciousness: Resting on left side. Eyes closed.    Airway & Oxygen Therapy: Patient Spontanous Breathing  Post-op Assessment: Report given to RN and Post -op Vital signs reviewed and stable  Post vital signs: Reviewed and stable  Last Vitals:  Vitals:   07/04/16 0900  BP: 125/63  Pulse: 92  Resp: 18  Temp: 37 C    Last Pain:  Vitals:   07/04/16 0900  TempSrc: Oral         Complications: No apparent anesthesia complications   Blow-by 02 100% in use.  Resting on left side with eyes closed.  RR even and unlabored.  VSS.

## 2016-07-04 NOTE — Anesthesia Postprocedure Evaluation (Signed)
Anesthesia Post Note  Patient: Karen Mcdaniel  Procedure(s) Performed: Procedure(s) (LRB): MICROLARYNGOSCOPY WITH LASER AND BALLOON DILATION (N/A)  Patient location during evaluation: PACU Anesthesia Type: General Level of consciousness: awake and alert, oriented and patient cooperative Pain management: pain level controlled Vital Signs Assessment: post-procedure vital signs reviewed and stable Respiratory status: spontaneous breathing, nonlabored ventilation and respiratory function stable Cardiovascular status: blood pressure returned to baseline and stable Postop Assessment: no signs of nausea or vomiting Anesthetic complications: no       Last Vitals:  Vitals:   07/04/16 1345 07/04/16 1400  BP: 113/80 104/88  Pulse: (!) 102 93  Resp: 19 13  Temp:      Last Pain:  Vitals:   07/04/16 1400  TempSrc:   PainSc: 3                  Zavannah Deblois,E. Macario Shear

## 2016-07-04 NOTE — H&P (View-Only) (Signed)
HPI:   Karen Mcdaniel is a 26 y.o. female patient of Patient Does Not Have Pcp for evaluation of Hoarseness and breathing difficulty. 26 year old Hispanic female recently moved down from the [place] area. She was diagnosed with some sort of stenosis at the age of 55. She was seen at [facility] at the age of 39 but no therapy at that time. By the time she was 19, received dilation. She had a possible dilatation in December 2016 which helped very little. She had [facility] laryngoscopy with laser lysis of anterior glottic stenosis in May 2017. She was improved for a while but has been having worsening hoarseness for the past 3-4 months, and breathing difficulty for the last 3 or 4 days. She may have an upper respiratory infection but no facial pain or nasal drainage. I reviewed the operative report from May 2017 describing a two thirds fusion of the anterior vocal cords. Upon releasing the fusion, no subglottic or tracheal stenosis was noted. She was seen in the emergency room this morning and was improved after a course of racemic epinephrine, and Decadron. She is here for further consideration. No swallowing issues. Over the years, she has had ear surgery, gallbladder removal, appendectomy, open lung biopsy all apparently under successful general anesthesia. She has been on Prilosec once or twice daily for years.  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 PMH/Meds/All/SocHx/FamHx/ROS:   Past Medical History:  Diagnosis Date  . Acid reflux  . Hearing loss   Past Surgical History:  Procedure Laterality Date  . GALLBLADDER SURGERY   No family history of bleeding disorders, wound healing problems or difficulty with anesthesia.   Social History   Social History  . Marital status: Single  Spouse name: N/A  . Number of children: N/A  . Years of education: N/A   Occupational History  . Not on file.    Social History Main Topics  . Smoking status: Never Smoker  . Smokeless tobacco: Never Used  . Alcohol use Yes  . Drug use: Unknown  . Sexual activity: Not on file   Other Topics Concern  . Not on file   Social History Narrative  . No narrative on file   Current Outpatient Prescriptions:  . BUDESONIDE (PULMICORT INHL), Inhale into the lungs., Disp: , Rfl:  . docusate sodium (COLACE) 100 MG capsule, Take 100 mg by mouth 2 times daily., Disp: , Rfl:  . FERROUS SULFATE (IRON ORAL), Take by mouth., Disp: , Rfl:  . omeprazole (PRILOSEC) 40 MG capsule, Take 40 mg by mouth daily., Disp: , Rfl:  . amoxicillin (AMOXIL) 500 MG capsule, Take 1 capsule (500 mg total) by mouth 3 times daily., Disp: 30 capsule, Rfl: 0  A complete ROS was performed with pertinent positives/negatives noted in the HPI. The remainder of the ROS are negative.   Physical Exam:   Ht 1.473 m (4\' 10" )  Wt 83 kg (183 lb)  BMI 38.25 kg/m  She is petite. Her voice is aphonic. She has some inspiratory stridor. Mental status is appropriate. She hears reasonably in conversational speech with a hearing aid on the right side. The head is atraumatic and neck supple. Cranial nerves intact. Ear canals are clear. Both drums are scarred and retracted with possible fluid. Anterior nose shows a mild rightward septal deviation with healthy mucosa. Oral cavity is clear with teeth in good repair. Oropharynx clear. Neck without adenopathy. No thyromegaly. I could not get an  adequate look at the larynx with a mirror examination.  Using the flexible laryngoscope, she has a strong gag reflex. There is some greenish mucoid material in both sides of the nose and the nasopharynx. On brief inspection of the larynx, the vocal cords appear to be fused approximately halfway from the anterior commissure back. The airway looks okay. There is some posterior laryngeal erythema and swelling.  Flexible Laryngoscopy  Indications were discussed. Details  of the procedure were explained. Questions were answered and informed consent was obtained verbally.   Technique: After anesthetizing the nasal cavity with topical lidocaine and oxymetazoline, the flexible endoscope was introduced and passed through the right nasal cavity into the nasopharynx. The nasopharynx was inspected with normal eustachian tori. Oropharynx normal with minimal lingual tonsils and no lesions. Hypopharynx/Larynx shows mobile vocal cords with good airway. No pooling in valleculae or pyriforms. The scope was withdrawn from the nose. She tolerated the procedure well.   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 Recent upper respiratory infection/possible sinusitis as an acute insult causing deterioration.  Plan: I gave her nasal hygiene instructions and a prescription for amoxicillin. I will see her back in 2 weeks. She may once again need lysis of the anterior cords for more definitive treatment, she may need a laryngotomy with placement of a keel which would likely require a tracheostomy.

## 2016-07-04 NOTE — Op Note (Signed)
OPERATIVE REPORT  DATE OF SURGERY: 11/03/4763  PATIENT:  Karen Mcdaniel,  26 y.o. female  PRE-OPERATIVE DIAGNOSIS:  GLOTTIC STENOSIS   POST-OPERATIVE DIAGNOSIS:  GLOTTIC STENOSIS   PROCEDURE:  Procedure(s): MICROLARYNGOSCOPY WITH LASER AND BALLOON DILATION  SURGEON:  Beckie Salts, MD  ASSISTANTS: none  ANESTHESIA:   General   EBL:  3 ml  DRAINS: Nonenone  LOCAL MEDICATIONS USED:  None  SPECIMEN:  none  COUNTS:  Correct  PROCEDURE DETAILS: The patient was taken to the operating room and placed on the operating table in the supine position. Following induction of general endotracheal  anesthesia using a 5.5 endotracheal tube, it was turned 90 and the patient was draped in a standard fashion including saline soaked pads on the eyes and around the face and upper chest area for laser protection. A maxillary tooth protector was used throughout the case. A Jako laryngoscope was entered into the oral cavity used to visualize the larynx and attached to the Mayo stand with the suspension apparatus. Using then replaced through the laryngoscope. This was done several times throughout the case. The operating microscope was brought into view and the CO2 laser was attached to the microscope. Inspection of the larynx reveals a thick anterior glottic web that extended into the subglottis. The laser was used on a setting of 3 W continuous power to divide the anterior web easily thickened throughout. On several sessions to relieve the web. In between each treatment, the tube was replaced. The balloon dilator was used twice in the glottic and subglottic region and dilated up to 4.5 atm corresponding to 13.5 mm. Several suspension was then injected into both sides of the lysed cords, total of 0.5 mL. A #6 endotracheal tube was then placed through the scope. The scope was carefully removed. The patient was awakened exudate and transferred to recovery in stable condition.    PATIENT DISPOSITION:  To  PACU, stable

## 2016-07-04 NOTE — Transfer of Care (Deleted)
Immediate Anesthesia Transfer of Care Note  Patient: Karen Mcdaniel  Procedure(s) Performed: Procedure(s): MICROLARYNGOSCOPY WITH LASER AND BALLOON DILATION (N/A)  Patient Location: PACU  Anesthesia Type:General  Level of Consciousness: Resting on left side. Eyes closed.   Airway & Oxygen Therapy: Spontaneous RR.  Even and unlabored. 02 blow-by 100%  Post-op Assessment: Report given to RN and Post -op Vital signs reviewed and stable  Post vital signs: Reviewed and stable  Last Vitals:  Vitals:   07/04/16 0900  BP: 125/63  Pulse: 92  Resp: 18  Temp: 37 C    Last Pain:  Vitals:   07/04/16 0900  TempSrc: Oral         Complications: No apparent anesthesia complications

## 2016-07-04 NOTE — Transfer of Care (Signed)
Immediate Anesthesia Transfer of Care Note  Patient: Karen Mcdaniel  Procedure(s) Performed: Procedure(s): MICROLARYNGOSCOPY WITH LASER AND BALLOON DILATION (N/A)  Patient Location: PACU  Anesthesia Type:General  Level of Consciousness: awake and patient cooperative  Airway & Oxygen Therapy: Patient Spontanous Breathing and Patient connected to face mask oxygen  Post-op Assessment: Report given to RN and Post -op Vital signs reviewed and stable  Post vital signs: Reviewed and stable  Last Vitals:  Vitals:   07/04/16 0900 07/04/16 1230  BP: 125/63 118/75  Pulse: 92 93  Resp: 18 18  Temp: 37 C     Last Pain:  Vitals:   07/04/16 0900  TempSrc: Oral         Complications: No apparent anesthesia complications

## 2016-07-04 NOTE — Anesthesia Procedure Notes (Signed)
Procedure Name: Intubation Date/Time: 07/04/2016 11:35 AM Performed by: Oletta Lamas Pre-anesthesia Checklist: Patient identified, Emergency Drugs available, Suction available and Patient being monitored Patient Re-evaluated:Patient Re-evaluated prior to inductionOxygen Delivery Method: Circle System Utilized Preoxygenation: Pre-oxygenation with 100% oxygen Intubation Type: IV induction Ventilation: Mask ventilation without difficulty Laryngoscope Size: Mac and 3 Grade View: Grade I Tube type: Oral Tube size: 5.0 mm Number of attempts: 1 Airway Equipment and Method: Stylet Placement Confirmation: ETT inserted through vocal cords under direct vision,  positive ETCO2 and breath sounds checked- equal and bilateral Secured at: 19 cm Tube secured with: Tape Dental Injury: Teeth and Oropharynx as per pre-operative assessment  Comments: Small tube per surgeon

## 2016-07-04 NOTE — Interval H&P Note (Signed)
History and Physical Interval Note:  07/04/2016 10:06 AM  Karen Mcdaniel  has presented today for surgery, with the diagnosis of GLOTTIC STENOSIS   The various methods of treatment have been discussed with the patient and family. After consideration of risks, benefits and other options for treatment, the patient has consented to  Procedure(s): MICROLARYNGOSCOPY WITH LASER AND BALLOON DILATION (N/A) as a surgical intervention .  The patient's history has been reviewed, patient examined, no change in status, stable for surgery.  I have reviewed the patient's chart and labs.  Questions were answered to the patient's satisfaction.     [name]

## 2016-07-04 NOTE — Anesthesia Preprocedure Evaluation (Addendum)
Anesthesia Evaluation  Patient identified by MRN, date of birth, ID band Patient awake    Reviewed: Allergy & Precautions, NPO status , Patient's Chart, lab work & pertinent test results  History of Anesthesia Complications Negative for: history of anesthetic complications  Airway Mallampati: II  TM Distance: >3 FB Neck ROM: Full    Dental  (+) Dental Advisory Given   Pulmonary  Laryngeal stenosis   breath sounds clear to auscultation       Cardiovascular negative cardio ROS   Rhythm:Regular Rate:Normal     Neuro/Psych    GI/Hepatic Neg liver ROS, GERD  Medicated and Controlled,  Endo/Other  Morbid obesity  Renal/GU negative Renal ROS     Musculoskeletal   Abdominal (+) + obese,   Peds  Hematology  (+) Blood dyscrasia (Hb 9.5), ,   Anesthesia Other Findings   Reproductive/Obstetrics LMP a week ago, pt states she is not sexually active                            Anesthesia Physical Anesthesia Plan  ASA: II  Anesthesia Plan: General   Post-op Pain Management:    Induction: Intravenous  Airway Management Planned: Oral ETT  Additional Equipment:   Intra-op Plan:   Post-operative Plan: Extubation in OR  Informed Consent: I have reviewed the patients History and Physical, chart, labs and discussed the procedure including the risks, benefits and alternatives for the proposed anesthesia with the patient or authorized representative who has indicated his/her understanding and acceptance.   Dental advisory given  Plan Discussed with: CRNA and Surgeon  Anesthesia Plan Comments: (Plan routine monitors, GETA)        Anesthesia Quick Evaluation

## 2016-07-05 ENCOUNTER — Encounter (HOSPITAL_COMMUNITY): Payer: Self-pay | Admitting: Otolaryngology

## 2016-10-31 DIAGNOSIS — Q310 Web of larynx: Secondary | ICD-10-CM | POA: Insufficient documentation

## 2017-01-24 DIAGNOSIS — H9193 Unspecified hearing loss, bilateral: Secondary | ICD-10-CM | POA: Insufficient documentation

## 2017-03-04 ENCOUNTER — Emergency Department (HOSPITAL_COMMUNITY)
Admission: EM | Admit: 2017-03-04 | Discharge: 2017-03-04 | Disposition: A | Discharge status: Home / Self Care | Payer: 59 | Attending: Emergency Medicine | Admitting: Emergency Medicine

## 2017-03-04 ENCOUNTER — Encounter (HOSPITAL_COMMUNITY): Payer: Self-pay

## 2017-03-04 DIAGNOSIS — K529 Noninfective gastroenteritis and colitis, unspecified: Secondary | ICD-10-CM

## 2017-03-04 DIAGNOSIS — R1010 Upper abdominal pain, unspecified: Secondary | ICD-10-CM | POA: Diagnosis present

## 2017-03-04 DIAGNOSIS — Z79899 Other long term (current) drug therapy: Secondary | ICD-10-CM | POA: Diagnosis not present

## 2017-03-04 DIAGNOSIS — K5289 Other specified noninfective gastroenteritis and colitis: Secondary | ICD-10-CM | POA: Diagnosis not present

## 2017-03-04 LAB — URINALYSIS, ROUTINE W REFLEX MICROSCOPIC
Bilirubin Urine: NEGATIVE
GLUCOSE, UA: NEGATIVE mg/dL
Hgb urine dipstick: NEGATIVE
KETONES UR: NEGATIVE mg/dL
LEUKOCYTES UA: NEGATIVE
Nitrite: NEGATIVE
PH: 5 (ref 5.0–8.0)
Protein, ur: NEGATIVE mg/dL
Specific Gravity, Urine: 1.02 (ref 1.005–1.030)

## 2017-03-04 LAB — COMPREHENSIVE METABOLIC PANEL
ALK PHOS: 92 U/L (ref 38–126)
ALT: 13 U/L — AB (ref 14–54)
AST: 19 U/L (ref 15–41)
Albumin: 3.6 g/dL (ref 3.5–5.0)
Anion gap: 9 (ref 5–15)
BUN: 7 mg/dL (ref 6–20)
CALCIUM: 8.7 mg/dL — AB (ref 8.9–10.3)
CO2: 22 mmol/L (ref 22–32)
CREATININE: 0.6 mg/dL (ref 0.44–1.00)
Chloride: 106 mmol/L (ref 101–111)
Glucose, Bld: 103 mg/dL — ABNORMAL HIGH (ref 65–99)
Potassium: 3.9 mmol/L (ref 3.5–5.1)
Sodium: 137 mmol/L (ref 135–145)
Total Bilirubin: 0.7 mg/dL (ref 0.3–1.2)
Total Protein: 7.7 g/dL (ref 6.5–8.1)

## 2017-03-04 LAB — CBC
HCT: 31.8 % — ABNORMAL LOW (ref 36.0–46.0)
Hemoglobin: 9.7 g/dL — ABNORMAL LOW (ref 12.0–15.0)
MCH: 17.1 pg — ABNORMAL LOW (ref 26.0–34.0)
MCHC: 30.5 g/dL (ref 30.0–36.0)
MCV: 56.1 fL — ABNORMAL LOW (ref 78.0–100.0)
PLATELETS: 371 10*3/uL (ref 150–400)
RBC: 5.67 MIL/uL — AB (ref 3.87–5.11)
RDW: 19.7 % — ABNORMAL HIGH (ref 11.5–15.5)
WBC: 12.5 10*3/uL — AB (ref 4.0–10.5)

## 2017-03-04 LAB — LIPASE, BLOOD: Lipase: 19 U/L (ref 11–51)

## 2017-03-04 LAB — PREGNANCY, URINE: Preg Test, Ur: NEGATIVE

## 2017-03-04 MED ORDER — ONDANSETRON HCL 4 MG/2ML IJ SOLN
4.0000 mg | Freq: Once | INTRAMUSCULAR | Status: AC
  Administered 2017-03-04: 4 mg via INTRAVENOUS
  Filled 2017-03-04: qty 2

## 2017-03-04 MED ORDER — FAMOTIDINE 20 MG PO TABS: 20.0000 mg | ORAL_TABLET | Freq: Two times a day (BID) | ORAL | 0 refills | Status: DC

## 2017-03-04 MED ORDER — GI COCKTAIL ~~LOC~~
30.0000 mL | Freq: Once | ORAL | Status: AC
  Administered 2017-03-04: 30 mL via ORAL
  Filled 2017-03-04: qty 30

## 2017-03-04 MED ORDER — ONDANSETRON 4 MG PO TBDP: 4.0000 mg | ORAL_TABLET | Freq: Three times a day (TID) | ORAL | 0 refills | Status: DC | PRN

## 2017-03-04 MED ORDER — SODIUM CHLORIDE 0.9 % IV BOLUS (SEPSIS)
1000.0000 mL | Freq: Once | INTRAVENOUS | Status: AC
  Administered 2017-03-04: 1000 mL via INTRAVENOUS

## 2017-03-04 MED ORDER — PANTOPRAZOLE SODIUM 40 MG PO TBEC: 40.0000 mg | DELAYED_RELEASE_TABLET | Freq: Two times a day (BID) | ORAL | 0 refills | Status: DC

## 2017-03-04 MED ORDER — FAMOTIDINE 20 MG PO TABS
20.0000 mg | ORAL_TABLET | Freq: Once | ORAL | Status: AC
  Administered 2017-03-04: 20 mg via ORAL
  Filled 2017-03-04: qty 1

## 2017-03-04 NOTE — ED Provider Notes (Signed)
[facility] Provider Note   CSN: [id number] Arrival date & time: 03/04/17  0957     History   Chief Complaint Chief Complaint  Patient presents with  . Abdominal Pain    HPI Karen Mcdaniel is a 26 y.o. female.  HPI 26 year old female with a history of a cholecystectomy and appendectomy presents with upper abdominal pain.  Started this morning at 2 AM.  At about 5 AM she had vomiting about 9 episodes of vomiting.  Has been feeling lightheaded towards the end of the vomiting episodes and continues to feel lightheaded like she might pass out.  Currently feels nauseated.  Has had a couple episodes of loose watery stools.  Her vomit has been yellow, no blood.  The upper abdominal pain does not radiate and feels sharp.  Feels like when she had her gallbladder issues about 10 years ago.  No fevers, chest pain, back pain.  No urinary symptoms or vaginal symptoms.  Has not taken anything for the symptoms.  Past Medical History:  Diagnosis Date  . Anemia   . Appendicitis   . Dyspnea    with exertion  . GERD (gastroesophageal reflux disease)   . Hard of hearing   . Headache   . History of kidney stones   . Obesity     There are no active problems to display for this patient.   Past Surgical History:  Procedure Laterality Date  . APPENDECTOMY    . CHOLECYSTECTOMY    . LUNG BIOPSY    . MICROLARYNGOSCOPY WITH LASER AND BALLOON DILATION N/A 07/04/2016   Procedure: MICROLARYNGOSCOPY WITH LASER AND BALLOON DILATION;  Surgeon: Serena ColonelJefry Rosen, MD;  Location: [facility] OR;  Service: ENT;  Laterality: N/A;  . TONSILLECTOMY      OB History    No data available       Home Medications    Prior to Admission medications   Medication Sig Start Date End Date Taking? Authorizing Provider  Budesonide (PULMICORT FLEXHALER) 90 MCG/ACT inhaler Inhale 1 puff into the lungs 2 (two) times daily as needed (shortness of breath).    [provider]    cephALEXin (KEFLEX) 500 MG capsule Take 1 capsule (500 mg total) by mouth 3 (three) times daily. 07/04/16   Serena Colonelosen, Jefry, MD  docusate sodium (COLACE) 100 MG capsule Take 100 mg by mouth 2 (two) times daily.    [provider]  famotidine (PEPCID) 20 MG tablet Take 1 tablet (20 mg total) by mouth 2 (two) times daily. 03/04/17   Pricilla LovelessGoldston, Yunus Stoklosa, MD  ferrous sulfate 325 (65 FE) MG tablet Take 325 mg by mouth 2 (two) times daily with a meal.    [provider]  ibuprofen (ADVIL,MOTRIN) 800 MG tablet Take 1 tablet (800 mg total) by mouth every 8 (eight) hours as needed for mild pain. Patient not taking: Reported on 05/03/2016 01/10/16   Ward, Layla MawKristen N, DO  omeprazole (PRILOSEC) 40 MG capsule Take 40 mg by mouth 2 (two) times daily.    [provider]  ondansetron (ZOFRAN ODT) 4 MG disintegrating tablet Take 1 tablet (4 mg total) by mouth every 8 (eight) hours as needed for nausea or vomiting. 03/04/17   Pricilla LovelessGoldston, Leyla Soliz, MD  pantoprazole (PROTONIX) 40 MG tablet Take 1 tablet (40 mg total) by mouth 2 (two) times daily. 03/04/17   Pricilla LovelessGoldston, Jazariah Teall, MD  predniSONE (STERAPRED UNI-PAK 21 TAB) 10 MG (21) TBPK tablet Take 3 tablets daily for 3 days, followed by  2 tablets daily for 3 days, followed by 1 tablet daily for the remainder. 07/04/16   Serena Colonel, MD    Family History No family history on file.  Social History Social History  Substance Use Topics  . Smoking status: Never Smoker  . Smokeless tobacco: Never Used  . Alcohol use No     Allergies   No known allergies   Review of Systems Review of Systems  Constitutional: Negative for fever.  Respiratory: Negative for shortness of breath.   Cardiovascular: Negative for chest pain.  Gastrointestinal: Positive for abdominal pain, diarrhea, nausea and vomiting. Negative for blood in stool.  Genitourinary: Negative for dysuria, vaginal bleeding and vaginal discharge.  Musculoskeletal: Negative for back pain.  All  other systems reviewed and are negative.    Physical Exam Updated Vital Signs BP 101/64 (BP Location: Left Arm)   Pulse 95   Temp 98.8 F (37.1 C) (Oral)   Resp 17   Ht 4\' 10"  (1.473 m)   Wt 88.9 kg (196 lb)   LMP 02/04/2017 (Within Days)   SpO2 100%   BMI 40.96 kg/m   Physical Exam  Constitutional: She is oriented to person, place, and time. She appears well-developed and well-nourished. No distress.  HENT:  Head: Normocephalic and atraumatic.  Right Ear: External ear normal.  Left Ear: External ear normal.  Nose: Nose normal.  Eyes: Right eye exhibits no discharge. Left eye exhibits no discharge.  Cardiovascular: Normal rate, regular rhythm and normal heart sounds.   Pulmonary/Chest: Effort normal and breath sounds normal.  Abdominal: Soft. There is tenderness in the epigastric area.  Neurological: She is alert and oriented to person, place, and time.  Skin: Skin is warm and dry. She is not diaphoretic.  Nursing note and vitals reviewed.    ED Treatments / Results  Labs (all labs ordered are listed, but only abnormal results are displayed) Labs Reviewed  COMPREHENSIVE METABOLIC PANEL - Abnormal; Notable for the following:       Result Value   Glucose, Bld 103 (*)    Calcium 8.7 (*)    ALT 13 (*)    All other components within normal limits  CBC - Abnormal; Notable for the following:    WBC 12.5 (*)    RBC 5.67 (*)    Hemoglobin 9.7 (*)    HCT 31.8 (*)    MCV 56.1 (*)    MCH 17.1 (*)    RDW 19.7 (*)    All other components within normal limits  LIPASE, BLOOD  URINALYSIS, ROUTINE W REFLEX MICROSCOPIC  PREGNANCY, URINE    EKG  EKG Interpretation None       Radiology No results found.  Procedures Procedures (including critical care time)  Medications Ordered in ED Medications  famotidine (PEPCID) tablet 20 mg (not administered)  sodium chloride 0.9 % bolus 1,000 mL (1,000 mLs Intravenous New Bag/Given 03/04/17 1553)  ondansetron (ZOFRAN)  injection 4 mg (4 mg Intravenous Given 03/04/17 1552)  gi cocktail (Maalox,Lidocaine,Donnatal) (30 mLs Oral Given 03/04/17 1551)     Initial Impression / Assessment and Plan / ED Course  I have reviewed the triage vital signs and the nursing notes.  Pertinent labs & imaging results that were available during my care of the patient were reviewed by me and considered in my medical decision making (see chart for details).     No vomiting while the patient was in the ED.  She feels somewhat better with fluids and treatment.  Given  no vomiting and benign abdominal exam with focal epigastric tenderness, I think she is stable for discharge home.  She does not have a gallbladder.  Mild white blood cell count elevation is likely from stress response.  However I doubt acute obstruction.  I think this is likely gastritis or gastroenteritis.  Treat with PPI, Pepcid, antiemetics and plenty of fluids.  Discharge home with return precautions.  Final Clinical Impressions(s) / ED Diagnoses   Final diagnoses:  Upper abdominal pain  Gastroenteritis    New Prescriptions New Prescriptions   FAMOTIDINE (PEPCID) 20 MG TABLET    Take 1 tablet (20 mg total) by mouth 2 (two) times daily.   ONDANSETRON (ZOFRAN ODT) 4 MG DISINTEGRATING TABLET    Take 1 tablet (4 mg total) by mouth every 8 (eight) hours as needed for nausea or vomiting.   PANTOPRAZOLE (PROTONIX) 40 MG TABLET    Take 1 tablet (40 mg total) by mouth 2 (two) times daily.     Pricilla Loveless, MD 03/04/17 [phone]

## 2017-03-04 NOTE — ED Triage Notes (Signed)
Per Pt, Pt is coming from up with upper abdominal pain that started this morning. Pt report shaving nine episodes of vomiting after the pain along with diarrhea. Hx of Gallbladder and Appendix removed.

## 2017-03-04 NOTE — ED Notes (Signed)
Pt's IV fluid has approximately 150 ml left.

## 2017-05-13 DIAGNOSIS — D500 Iron deficiency anemia secondary to blood loss (chronic): Secondary | ICD-10-CM | POA: Insufficient documentation

## 2018-08-04 ENCOUNTER — Ambulatory Visit (HOSPITAL_COMMUNITY): Payer: Self-pay | Admitting: Psychiatry

## 2018-08-19 ENCOUNTER — Ambulatory Visit (INDEPENDENT_AMBULATORY_CARE_PROVIDER_SITE_OTHER): Payer: Self-pay | Admitting: Psychiatry

## 2018-08-19 ENCOUNTER — Other Ambulatory Visit: Payer: Self-pay

## 2018-08-19 ENCOUNTER — Encounter (HOSPITAL_COMMUNITY): Payer: Self-pay | Admitting: Psychiatry

## 2018-08-19 DIAGNOSIS — F4312 Post-traumatic stress disorder, chronic: Secondary | ICD-10-CM

## 2018-08-19 DIAGNOSIS — F410 Panic disorder [episodic paroxysmal anxiety] without agoraphobia: Secondary | ICD-10-CM

## 2018-08-19 DIAGNOSIS — F064 Anxiety disorder due to known physiological condition: Secondary | ICD-10-CM
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
## 2018-08-27 ENCOUNTER — Encounter (HOSPITAL_COMMUNITY): Payer: Self-pay | Admitting: Psychiatry

## 2018-08-27 ENCOUNTER — Ambulatory Visit (INDEPENDENT_AMBULATORY_CARE_PROVIDER_SITE_OTHER): Payer: Self-pay | Admitting: Psychiatry

## 2018-08-27 ENCOUNTER — Other Ambulatory Visit: Payer: Self-pay

## 2018-08-27 DIAGNOSIS — F064 Anxiety disorder due to known physiological condition: Secondary | ICD-10-CM

## 2018-08-27 DIAGNOSIS — F410 Panic disorder [episodic paroxysmal anxiety] without agoraphobia: Secondary | ICD-10-CM

## 2018-08-27 DIAGNOSIS — F4312 Post-traumatic stress disorder, chronic: Secondary | ICD-10-CM

## 2018-08-27 NOTE — Progress Notes (Signed)
Virtual Visit via Video Note  I connected with Karen Mcdaniel on 39/03/00 at  2:30 PM EDT by a video enabled telemedicine application and verified that I am speaking with the correct person using two identifiers.   I discussed the limitations of evaluation and management by telemedicine and the availability of in person appointments. The patient expressed understanding and agreed to proceed.  History of Present Illness: Anxiety disorder due to medical condition with panic attacks and chronic PTSD due to adverse childhood experiences.    Observations/Objective: Counselor met with Karen Mcdaniel for individual therapy via Webex. Counselor reviewed CCA experience to ensure information was accurate and that she felt heard. Counselor assessed mental health symptoms. Karen Mcdaniel reported that she had a better week because her grandparents and aunt were visiting. She reported reduced anxiety and depression. Counselor explored self-esteem issues and how she perceives others images of her. Counselor explored relationships within her family dynamics. Counselor checked in about health and medical traumas to better understand how she is impacted on a daily basis. Counselor discussed coping skills and willingness to complete therapy homework between sessions. Counselor encouraged healthy expression of emotions and communication skills. Counselor and Karen Mcdaniel communicated about plan for future sessions. Karen Mcdaniel reports being more motivated for therapy than for attempting use of prescribed medications to address mental health issues.   Assessment and Plan: Counselor will meet with Karen Mcdaniel again in 2 weeks to continue work on treatment plan goals. Karen Mcdaniel will start practicing self-care and communication skills.   Follow Up Instructions: Counselor will send Webex link for next session.   I discussed the assessment and treatment plan with the patient. The patient was provided an opportunity to ask questions and all were  answered. The patient agreed with the plan and demonstrated an understanding of the instructions.   The patient was advised to call back or seek an in-person evaluation if the symptoms worsen or if the condition fails to improve as anticipated.  I provided 53 minutes of non-face-to-face time during this encounter.   Lise Auer, LCSW

## 2018-09-10 ENCOUNTER — Encounter (HOSPITAL_COMMUNITY): Payer: Self-pay | Admitting: Psychiatry

## 2018-09-10 ENCOUNTER — Other Ambulatory Visit: Payer: Self-pay

## 2018-09-10 ENCOUNTER — Ambulatory Visit (INDEPENDENT_AMBULATORY_CARE_PROVIDER_SITE_OTHER): Payer: 59 | Admitting: Psychiatry

## 2018-09-10 DIAGNOSIS — F064 Anxiety disorder due to known physiological condition: Secondary | ICD-10-CM | POA: Diagnosis not present

## 2018-09-10 DIAGNOSIS — F410 Panic disorder [episodic paroxysmal anxiety] without agoraphobia: Secondary | ICD-10-CM | POA: Diagnosis not present

## 2018-09-10 DIAGNOSIS — F4312 Post-traumatic stress disorder, chronic: Secondary | ICD-10-CM

## 2018-09-10 NOTE — Progress Notes (Signed)
Virtual Visit via Video Note  I connected with Karen Mcdaniel on 11/94/17 at  2:30 PM EDT by a video enabled telemedicine application and verified that I am speaking with the correct person using two identifiers.  Location: Patient: Karen Mcdaniel Provider: Lise Auer, LCSW   I discussed the limitations of evaluation and management by telemedicine and the availability of in person appointments. The patient expressed understanding and agreed to proceed.  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   Observations/Objective: Counselor met with Karen Mcdaniel via Webex for individual therapy. Counselor assessed mental health status. Karen Mcdaniel denied panic attacks or depression since our last session. She attributes this to her grandparents visiting with her and sharing a room with them, which was comforting. Counselor explored family dynamics and relationships. Karen Mcdaniel expressed that she was angry today because one of her cousins overdosed and her two children found her. We processed her feelings and feeling expression with others. Counselor provided psychoeducation on grief and loss and anger management. Counselor shared coping strategies. Karen Mcdaniel shared about her past ways of coping and dealing with anger. Counselor encouraged Karen Mcdaniel to be safe and to focus on her needs and emotions during this time and to validate how others are feeling as well.   Assessment and Plan: Counselor will continue meeting with Karen Mcdaniel to address treatment plan goals. Karen Mcdaniel will attempt to use coping strategies learned in session.   Follow Up Instructions: Counselor will set up next session via Webex.    I discussed the assessment and treatment plan with the patient. The patient was provided an opportunity to ask questions and all were answered. The patient agreed with the plan and demonstrated an understanding  of the instructions.   The patient was advised to call back or seek an in-person evaluation if the symptoms worsen or if the condition fails to improve as anticipated.  I provided 45 minutes of non-face-to-face time during this encounter.   Lise Auer, LCSW

## 2018-09-16 ENCOUNTER — Encounter (HOSPITAL_COMMUNITY): Payer: Self-pay

## 2018-09-16 ENCOUNTER — Emergency Department (HOSPITAL_COMMUNITY)
Admission: EM | Admit: 2018-09-16 | Discharge: 2018-09-16 | Disposition: A | Discharge status: Home / Self Care | Payer: 59 | Attending: Emergency Medicine | Admitting: Emergency Medicine

## 2018-09-16 ENCOUNTER — Other Ambulatory Visit: Payer: Self-pay

## 2018-09-16 ENCOUNTER — Emergency Department (HOSPITAL_COMMUNITY): Payer: 59

## 2018-09-16 DIAGNOSIS — Z79899 Other long term (current) drug therapy: Secondary | ICD-10-CM | POA: Insufficient documentation

## 2018-09-16 DIAGNOSIS — N390 Urinary tract infection, site not specified: Secondary | ICD-10-CM | POA: Diagnosis not present

## 2018-09-16 DIAGNOSIS — R109 Unspecified abdominal pain: Secondary | ICD-10-CM | POA: Diagnosis present

## 2018-09-16 DIAGNOSIS — N201 Calculus of ureter: Secondary | ICD-10-CM | POA: Diagnosis not present

## 2018-09-16 LAB — COMPREHENSIVE METABOLIC PANEL
ALT: 13 U/L (ref 0–44)
AST: 17 U/L (ref 15–41)
Albumin: 3.7 g/dL (ref 3.5–5.0)
Alkaline Phosphatase: 73 U/L (ref 38–126)
Anion gap: 12 (ref 5–15)
BUN: 5 mg/dL — ABNORMAL LOW (ref 6–20)
CO2: 20 mmol/L — ABNORMAL LOW (ref 22–32)
Calcium: 9 mg/dL (ref 8.9–10.3)
Chloride: 107 mmol/L (ref 98–111)
Creatinine, Ser: 0.8 mg/dL (ref 0.44–1.00)
GFR calc Af Amer: >60 mL/min (ref >60)
GFR calc non Af Amer: >60 mL/min (ref >60)
Glucose, Bld: 117 mg/dL — ABNORMAL HIGH (ref 70–99)
Potassium: 3.9 mmol/L (ref 3.5–5.1)
Sodium: 139 mmol/L (ref 135–145)
Total Bilirubin: 0.3 mg/dL (ref 0.3–1.2)
Total Protein: 7.5 g/dL (ref 6.5–8.1)

## 2018-09-16 LAB — I-STAT BETA HCG BLOOD, ED (MC, WL, AP ONLY): I-stat hCG, quantitative: <5 m[IU]/mL (ref <5)

## 2018-09-16 LAB — URINALYSIS, ROUTINE W REFLEX MICROSCOPIC
Bilirubin Urine: NEGATIVE
Glucose, UA: NEGATIVE mg/dL
Ketones, ur: NEGATIVE mg/dL
Nitrite: POSITIVE — AB
Protein, ur: 30 mg/dL — AB
Specific Gravity, Urine: 1.025 (ref 1.005–1.030)
pH: 5.5 (ref 5.0–8.0)

## 2018-09-16 LAB — CBC
HCT: 41.5 % (ref 36.0–46.0)
Hemoglobin: 12.6 g/dL (ref 12.0–15.0)
MCH: 20.7 pg — ABNORMAL LOW (ref 26.0–34.0)
MCHC: 30.4 g/dL (ref 30.0–36.0)
MCV: 68.3 fL — ABNORMAL LOW (ref 80.0–100.0)
Platelets: 352 10*3/uL (ref 150–400)
RBC: 6.08 MIL/uL — ABNORMAL HIGH (ref 3.87–5.11)
RDW: 14.7 % (ref 11.5–15.5)
WBC: 10.8 10*3/uL — ABNORMAL HIGH (ref 4.0–10.5)
nRBC: 0 % (ref 0.0–0.2)

## 2018-09-16 LAB — URINALYSIS, MICROSCOPIC (REFLEX)

## 2018-09-16 LAB — LIPASE, BLOOD: Lipase: 26 U/L (ref 11–51)

## 2018-09-16 MED ORDER — OXYCODONE-ACETAMINOPHEN 5-325 MG PO TABS: 1.0000 | ORAL_TABLET | Freq: Three times a day (TID) | ORAL | 0 refills | Status: DC | PRN

## 2018-09-16 MED ORDER — ONDANSETRON HCL 4 MG/2ML IJ SOLN
4.0000 mg | Freq: Once | INTRAMUSCULAR | Status: AC | PRN
  Administered 2018-09-16: 4 mg via INTRAVENOUS
  Filled 2018-09-16: qty 2

## 2018-09-16 MED ORDER — ONDANSETRON 4 MG PO TBDP: 4.0000 mg | ORAL_TABLET | Freq: Three times a day (TID) | ORAL | 0 refills | Status: DC | PRN

## 2018-09-16 MED ORDER — SODIUM CHLORIDE 0.9 % IV BOLUS
1000.0000 mL | Freq: Once | INTRAVENOUS | Status: AC
  Administered 2018-09-16: 1000 mL via INTRAVENOUS

## 2018-09-16 MED ORDER — CEPHALEXIN 500 MG PO CAPS: 500.0000 mg | ORAL_CAPSULE | Freq: Two times a day (BID) | ORAL | 0 refills | Status: DC

## 2018-09-16 MED ORDER — METOCLOPRAMIDE HCL 5 MG/ML IJ SOLN
10.0000 mg | Freq: Once | INTRAMUSCULAR | Status: AC
  Administered 2018-09-16: 10 mg via INTRAVENOUS
  Filled 2018-09-16: qty 2

## 2018-09-16 MED ORDER — MORPHINE SULFATE (PF) 4 MG/ML IV SOLN
4.0000 mg | Freq: Once | INTRAVENOUS | Status: AC
  Administered 2018-09-16: 09:00:00 4 mg via INTRAVENOUS
  Filled 2018-09-16: qty 1

## 2018-09-16 MED ORDER — CEPHALEXIN 500 MG PO CAPS: 500.0000 mg | ORAL_CAPSULE | Freq: Two times a day (BID) | ORAL | 0 refills | Status: AC

## 2018-09-16 NOTE — Discharge Instructions (Addendum)
It is important for you to take your antibiotics as prescribed.  Please complete the entire course of the antibiotics even if your symptoms improve to prevent worsening or recurrence of your infection. I have sent your urine for culture.  If you need to be on a different antibiotic we will call you and let you know. You have a 5 mm right-sided kidney stone noted on your CT. I have spoken to the urologist on call, Dr. Alvester Morin.  He feels that it is reasonable for discharge home with pain control, nausea control and antibiotics. He encouraged you to follow-up at his clinic for an appointment. Return to the ED if at any point you develop a fever, have worsening pain, uncontrolled vomiting, lightheadedness or loss of consciousness.

## 2018-09-16 NOTE — ED Triage Notes (Signed)
Pt endorses right sided abd pain since 0330 this morning with n/v/d. Denies cough, fever, chills.

## 2018-09-16 NOTE — ED Notes (Signed)
Patient verbalizes understanding of discharge instructions. Opportunity for questioning and answers were provided. Armband removed by staff, pt discharged from ED. Prescription, pharmacy ([facility]), and follow up care reviewed. Pt wheeled to lobby for comfort. Pt alert, oriented, and ambulatory at discharge.

## 2018-09-16 NOTE — ED Provider Notes (Signed)
[facility] EMERGENCY DEPARTMENT Provider Note   CSN: [id number] Arrival date & time: 09/16/18  0750    History   Chief Complaint Chief Complaint  Patient presents with   Abdominal Pain    HPI Karen Mcdaniel is a 28 y.o. female with a past medical history of kidney stones, GERD, anemia presents to ED for 5-hour history of right-sided middle abdominal pain radiating to the right side of her back.  She reports associated nausea, nonbloody, nonbilious emesis and diarrhea.  She cannot recall any inciting event that may have triggered the symptoms.  She is unsure if this feels similar to her prior kidney stones.  She denies any dysuria, hematuria, sick contacts with similar symptoms, suspicious food ingestions, vaginal complaints, concern for pregnancy, alcohol, tobacco or other drug use.  Denies any cough, shortness of breath, chest pain or fever.  Prior abdominal surgeries include appendectomy and cholecystectomy.     HPI  Past Medical History:  Diagnosis Date   Anemia    Appendicitis    Dyspnea    with exertion   GERD (gastroesophageal reflux disease)    Hard of hearing    Headache    History of kidney stones    Obesity     There are no active problems to display for this patient.   Past Surgical History:  Procedure Laterality Date   APPENDECTOMY     CHOLECYSTECTOMY     LUNG BIOPSY     MICROLARYNGOSCOPY WITH LASER AND BALLOON DILATION N/A 07/04/2016   Procedure: MICROLARYNGOSCOPY WITH LASER AND BALLOON DILATION;  Surgeon: Serena Colonel, MD;  Location: [facility] OR;  Service: ENT;  Laterality: N/A;   TONSILLECTOMY       OB History   No obstetric history on file.      Home Medications    Prior to Admission medications   Medication Sig Start Date End Date Taking? Authorizing Provider  Budesonide (PULMICORT FLEXHALER) 90 MCG/ACT inhaler Inhale 1 puff into the lungs 2 (two) times daily as needed (shortness of breath).    [provider]  cephALEXin (KEFLEX) 500 MG capsule Take 1 capsule (500 mg total) by mouth 2 (two) times daily for 7 days. 09/16/18 09/23/18  Yarisbel Miranda, PA-C  docusate sodium (COLACE) 100 MG capsule Take 100 mg by mouth 2 (two) times daily.    [provider]  famotidine (PEPCID) 20 MG tablet Take 1 tablet (20 mg total) by mouth 2 (two) times daily. 03/04/17   Pricilla Loveless, MD  ferrous sulfate 325 (65 FE) MG tablet Take 325 mg by mouth 2 (two) times daily with a meal.    [provider]  ibuprofen (ADVIL,MOTRIN) 800 MG tablet Take 1 tablet (800 mg total) by mouth every 8 (eight) hours as needed for mild pain. Patient not taking: Reported on 05/03/2016 01/10/16   Ward, Layla Maw, DO  omeprazole (PRILOSEC) 40 MG capsule Take 40 mg by mouth 2 (two) times daily.    [provider]  ondansetron (ZOFRAN ODT) 4 MG disintegrating tablet Take 1 tablet (4 mg total) by mouth every 8 (eight) hours as needed for nausea or vomiting. 09/16/18   Merlean Pizzini, PA-C  oxyCODONE-acetaminophen (PERCOCET/ROXICET) 5-325 MG tablet Take 1 tablet by mouth every 8 (eight) hours as needed for severe pain. 09/16/18   Jedrick Hutcherson, PA-C  pantoprazole (PROTONIX) 40 MG tablet Take 1 tablet (40 mg total) by mouth 2 (two) times daily. 03/04/17   Pricilla Loveless, MD  predniSONE (STERAPRED UNI-PAK 21  TAB) 10 MG (21) TBPK tablet Take 3 tablets daily for 3 days, followed by 2 tablets daily for 3 days, followed by 1 tablet daily for the remainder. 07/04/16   Serena Colonelosen, Jefry, MD    Family History History reviewed. No pertinent family history.  Social History Social History   Tobacco Use   Smoking status: Never Smoker   Smokeless tobacco: Never Used  Substance Use Topics   Alcohol use: No   Drug use: No     Allergies   No known allergies   Review of Systems Review of Systems  Constitutional: Negative for appetite change, chills and fever.  HENT: Negative for ear pain, rhinorrhea, sneezing and sore throat.     Eyes: Negative for photophobia and visual disturbance.  Respiratory: Negative for cough, chest tightness, shortness of breath and wheezing.   Cardiovascular: Negative for chest pain and palpitations.  Gastrointestinal: Positive for abdominal pain, diarrhea, nausea and vomiting. Negative for blood in stool and constipation.  Genitourinary: Negative for dysuria, hematuria and urgency.  Musculoskeletal: Negative for myalgias.  Skin: Negative for rash.  Neurological: Negative for dizziness, weakness and light-headedness.     Physical Exam Updated Vital Signs BP (!) 132/91    Pulse 93    Temp 98.3 F (36.8 C) (Oral)    Resp 16    Ht 4\' 10"  (1.473 m)    Wt 85.7 kg    LMP 08/20/2018 (Approximate)    SpO2 100%    BMI 39.50 kg/m   Physical Exam Vitals signs and nursing note reviewed.  Constitutional:      General: She is not in acute distress.    Appearance: She is well-developed.     Comments: Dry heaving on my exam.  HENT:     Head: Normocephalic and atraumatic.     Nose: Nose normal.  Eyes:     General: No scleral icterus.       Left eye: No discharge.     Conjunctiva/sclera: Conjunctivae normal.  Neck:     Musculoskeletal: Normal range of motion and neck supple.  Cardiovascular:     Rate and Rhythm: Regular rhythm. Tachycardia present.     Heart sounds: Normal heart sounds. No murmur. No friction rub. No gallop.   Pulmonary:     Effort: Pulmonary effort is normal. No respiratory distress.     Breath sounds: Normal breath sounds.  Abdominal:     General: Bowel sounds are normal. There is no distension.     Palpations: Abdomen is soft.     Tenderness: There is abdominal tenderness (Right middle quadrant, right flank). There is no guarding.  Musculoskeletal: Normal range of motion.  Skin:    General: Skin is warm and dry.     Findings: No rash.  Neurological:     Mental Status: She is alert.     Motor: No abnormal muscle tone.     Coordination: Coordination normal.       ED Treatments / Results  Labs (all labs ordered are listed, but only abnormal results are displayed) Labs Reviewed  COMPREHENSIVE METABOLIC PANEL - Abnormal; Notable for the following components:      Result Value   CO2 20 (*)    Glucose, Bld 117 (*)    BUN 5 (*)    All other components within normal limits  CBC - Abnormal; Notable for the following components:   WBC 10.8 (*)    RBC 6.08 (*)    MCV 68.3 (*)    [facility]  20.[facility] (*)    All other components within normal limits  URINALYSIS, ROUTINE W REFLEX MICROSCOPIC - Abnormal; Notable for the following components:   Hgb urine dipstick LARGE (*)    Protein, ur 30 (*)    Nitrite POSITIVE (*)    Leukocytes,Ua TRACE (*)    All other components within normal limits  URINALYSIS, MICROSCOPIC (REFLEX) - Abnormal; Notable for the following components:   Bacteria, UA FEW (*)    All other components within normal limits  URINE CULTURE  LIPASE, BLOOD  I-STAT BETA HCG BLOOD, ED ([facility], [facility], [facility] ONLY)    EKG None  Radiology Ct Renal Stone Study  Result Date: 09/16/2018 CLINICAL DATA:  Right-sided pelvic pain with nausea, vomiting, and diarrhea. Flank pain. EXAM: CT ABDOMEN AND PELVIS WITHOUT CONTRAST TECHNIQUE: Multidetector CT imaging of the abdomen and pelvis was performed following the standard protocol without IV contrast. COMPARISON:  CT scan dated 01/10/2016 FINDINGS: Lower chest: No acute abnormality. Chronic scarring at the right lung base anteriorly, unchanged. Hepatobiliary: No focal liver abnormality is seen. Status post cholecystectomy. No biliary dilatation. Pancreas: Unremarkable. No pancreatic ductal dilatation or surrounding inflammatory changes. Spleen: Normal in size without focal abnormality. Adrenals/Urinary Tract: There is a 5 mm stone obstructing the right ureter just below the right renal pelvis creating minimal right hydronephrosis. No right renal calculi. Left kidney and adrenal glands are normal. Bladder is normal.  Stomach/Bowel: Stomach is within normal limits. Appendix has been removed. No evidence of bowel wall thickening, distention, or inflammatory changes. Vascular/Lymphatic: No significant vascular findings are present. No enlarged abdominal or pelvic lymph nodes. Reproductive: Uterus and bilateral adnexa are unremarkable. Other: No abdominal wall hernia or abnormality. No abdominopelvic ascites. Musculoskeletal: No acute or significant osseous findings. IMPRESSION: 1. 5 mm stone obstructing the proximal right ureter creating mild right hydronephrosis. 2. Otherwise benign appearing abdomen and pelvis. Electronically Signed   By: Francene Boyers M.D.   On: 09/16/2018 10:41    Procedures Procedures (including critical care time)  Medications Ordered in ED Medications  ondansetron (ZOFRAN) injection 4 mg (4 mg Intravenous Given 09/16/18 0811)  morphine 4 MG/ML injection 4 mg (4 mg Intravenous Given 09/16/18 0920)  sodium chloride 0.9 % bolus 1,000 mL (0 mLs Intravenous Stopped 09/16/18 1009)  metoCLOPramide (REGLAN) injection 10 mg (10 mg Intravenous Given 09/16/18 1007)     Initial Impression / Assessment and Plan / ED Course  I have reviewed the triage vital signs and the nursing notes.  Pertinent labs & imaging results that were available during my care of the patient were reviewed by me and considered in my medical decision making (see chart for details).        28 year old female presents to ED for right-sided abdominal pain and flank pain since earlier this morning.  She reports associated diarrhea, nausea and nonbloody, nonbilious emesis.  She is a history of kidney stones and is unsure if this feels similar.  She is status post appendectomy and cholecystectomy several years ago.  On my exam patient does appear uncomfortable.  She is vomiting, but no changes to tenderness with palpation.  She is afebrile with no recent use of antipyretics or history of fever.  Lab work significant for mild  leukocytosis of 10.8, urinalysis with positive nitrite, trace leukocytes, few bacteria and hematuria.  Lipase, CMP unremarkable.  Pregnancy test is negative.  CT renal stone study shows right-sided 5 mm obstructing stone at the proximal ureter.  I spoke to Dr. Alvester Morin of urology  who feels that it is appropriate to discharge patient with antibiotics if her symptoms are controlled here.  On recheck, patient resting comfortably.  Will give antiemetics, pain medication and antibiotics and follow-up information for urology.  Urine sent for culture. She knows to return for worse.  Patient is hemodynamically stable, in NAD, and able to ambulate in the ED. Evaluation does not show pathology that would require ongoing emergent intervention or inpatient treatment. I explained the diagnosis to the patient. Pain has been managed and has no complaints prior to discharge. Patient is comfortable with above plan and is stable for discharge at this time. All questions were answered prior to disposition. Strict return precautions for returning to the ED were discussed. Encouraged follow up with PCP.   An After Visit Summary was printed and given to the patient.   Portions of this note were generated with Scientist, clinical (histocompatibility and immunogenetics). Dictation errors may occur despite best attempts at proofreading.   Final Clinical Impressions(s) / ED Diagnoses   Final diagnoses:  Lower urinary tract infectious disease  Ureterolithiasis    ED Discharge Orders         Ordered    cephALEXin (KEFLEX) 500 MG capsule  2 times daily     09/16/18 1110    oxyCODONE-acetaminophen (PERCOCET/ROXICET) 5-325 MG tablet  Every 8 hours PRN     09/16/18 1110    ondansetron (ZOFRAN ODT) 4 MG disintegrating tablet  Every 8 hours PRN     09/16/18 1110           Dietrich Pates, PA-C 09/16/18 1113    Benjiman Core, MD 09/16/18 1526

## 2018-09-17 LAB — URINE CULTURE: Culture: NO GROWTH

## 2018-09-24 ENCOUNTER — Encounter (HOSPITAL_COMMUNITY): Payer: Self-pay | Admitting: Psychiatry

## 2018-09-24 ENCOUNTER — Other Ambulatory Visit: Payer: Self-pay

## 2018-09-24 ENCOUNTER — Ambulatory Visit (INDEPENDENT_AMBULATORY_CARE_PROVIDER_SITE_OTHER): Payer: 59 | Admitting: Psychiatry

## 2018-09-24 DIAGNOSIS — F410 Panic disorder [episodic paroxysmal anxiety] without agoraphobia: Secondary | ICD-10-CM | POA: Diagnosis not present

## 2018-09-24 DIAGNOSIS — F064 Anxiety disorder due to known physiological condition: Secondary | ICD-10-CM | POA: Diagnosis not present

## 2018-09-24 DIAGNOSIS — F4312 Post-traumatic stress disorder, chronic: Secondary | ICD-10-CM

## 2018-09-24 NOTE — Progress Notes (Signed)
Virtual Visit via Video Note  I connected with Karen Mcdaniel on [birth year] at  2:30 PM EDT by a video enabled telemedicine application and verified that I am speaking with the correct person using two identifiers.  Location: Patient: Karen Mcdaniel Provider: Lise Auer, LCSW   I discussed the limitations of evaluation and management by telemedicine and the availability of in person appointments. The patient expressed understanding and agreed to proceed.  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    Observations/Objective: Counselor met with Karen Mcdaniel for individual therapy via Webex. Counselor assessed MH symptoms and progress on treatment plan goals. Saja denied suicidal ideation or self-harm behaviors. Karen Mcdaniel shared that she has not had any panic attacks since our last session. She continues to feel comforted by her grandparents presence in her home. Counselor and Karen Mcdaniel explored the loss of her cousin and family dynamics at the funeral. Counselor and Karen Mcdaniel discussed an upcoming wedding she may attend and how past relationships may impact her response to other attendees. We reviewed healthy coping skills and explored thoughts and feelings using CBT and MI interventions. Karen Mcdaniel will continue to participate in therapy and manage her MH symptoms through utilizing coping skills.   Assessment and Plan: Counselor will continue to meet with Karen Mcdaniel to address treatment plan goals. Karen Mcdaniel will continue to follow recommendations of providers and implement skills learned in session.  Follow Up Instructions: Counselor will send information for next session via Webex.     I discussed the assessment and treatment plan with the patient. The patient was provided an opportunity to ask questions and all were answered. The patient agreed with the plan and demonstrated an  understanding of the instructions.   The patient was advised to call back or seek an in-person evaluation if the symptoms worsen or if the condition fails to improve as anticipated.  I provided 60 minutes of non-face-to-face time during this encounter.   Karen Auer, LCSW

## 2018-10-08 ENCOUNTER — Ambulatory Visit (HOSPITAL_COMMUNITY): Payer: Medicaid Other | Admitting: Psychiatry

## 2018-10-13 ENCOUNTER — Ambulatory Visit (INDEPENDENT_AMBULATORY_CARE_PROVIDER_SITE_OTHER): Payer: 59 | Admitting: Psychiatry

## 2018-10-13 ENCOUNTER — Other Ambulatory Visit: Payer: Self-pay

## 2018-10-13 DIAGNOSIS — F064 Anxiety disorder due to known physiological condition: Secondary | ICD-10-CM | POA: Diagnosis not present

## 2018-10-13 DIAGNOSIS — F410 Panic disorder [episodic paroxysmal anxiety] without agoraphobia: Secondary | ICD-10-CM

## 2018-10-13 DIAGNOSIS — F4312 Post-traumatic stress disorder, chronic: Secondary | ICD-10-CM

## 2018-10-13 NOTE — Progress Notes (Signed)
Virtual Visit via Telephone Note  I connected with [name] on 10/13/18 at  3:00 PM EDT by telephone and verified that I am speaking with the correct person using two identifiers.  Location: Patient: Karen Mcdaniel  Provider: Bethany Morris, LCSW   I discussed the limitations, risks, security and privacy concerns of performing an evaluation and management service by telephone and the availability of in person appointments. I also discussed with the patient that there may be a patient responsible charge related to this service. The patient expressed understanding and agreed to proceed.   History of Present Illness: Anxiety disorder due to general medical condition with panic attack and chronic PTSD due to adverse life experiences.    Observations/Objective: Counselor met with Karen Mcdaniel for individual therapy via Webex. Counselor assessed MH symptoms and progress on treatment plan goals. Karen Mcdaniel denied suicidal ideation or self-harm behaviors. Karen Mcdaniel shared that she has been experiencing more anxiety. Counselor explored root of anxiety. Karen Mcdaniel shared that she is concerned about the state of the world and specifically her mother. Counselor used CBT interventions to process thoughts and feelings. Counselor and Karen Mcdaniel discussed dynamics within her support system. Counselor provided mindfulness and grounding exercises for her to use between sessions.   Assessment and Plan: Counselor will continue to meet with Karen Mcdaniel to address treatment plan goals. Karen Mcdaniel will continue to follow recommendations of providers and implement skills learned in session.  Follow Up Instructions: Counselor will send information for next session via Webex.     I discussed the assessment and treatment plan with the patient. The patient was provided an opportunity to ask questions and all were answered. The patient agreed with the plan and demonstrated an understanding of the instructions.   The patient  was advised to call back or seek an in-person evaluation if the symptoms worsen or if the condition fails to improve as anticipated.  I provided 50 minutes of non-face-to-face time during this encounter.   Bethany Morris, LCSW  

## 2018-10-14 ENCOUNTER — Encounter (HOSPITAL_COMMUNITY): Payer: Self-pay | Admitting: Psychiatry

## 2018-10-22 ENCOUNTER — Ambulatory Visit (HOSPITAL_COMMUNITY): Payer: Medicaid Other | Admitting: Psychiatry

## 2018-11-12 DIAGNOSIS — Z9889 Other specified postprocedural states: Secondary | ICD-10-CM | POA: Insufficient documentation

## 2019-07-01 ENCOUNTER — Encounter (HOSPITAL_COMMUNITY): Payer: Self-pay

## 2019-07-01 ENCOUNTER — Emergency Department (HOSPITAL_COMMUNITY)
Admission: EM | Admit: 2019-07-01 | Discharge: 2019-07-02 | Disposition: A | Discharge status: Home / Self Care | Payer: 59 | Attending: Emergency Medicine | Admitting: Emergency Medicine

## 2019-07-01 ENCOUNTER — Other Ambulatory Visit: Payer: Self-pay

## 2019-07-01 DIAGNOSIS — Z79899 Other long term (current) drug therapy: Secondary | ICD-10-CM | POA: Insufficient documentation

## 2019-07-01 DIAGNOSIS — T407X1A Poisoning by cannabis (derivatives), accidental (unintentional), initial encounter: Secondary | ICD-10-CM | POA: Insufficient documentation

## 2019-07-01 DIAGNOSIS — T50901A Poisoning by unspecified drugs, medicaments and biological substances, accidental (unintentional), initial encounter: Secondary | ICD-10-CM

## 2019-07-01 MED ORDER — SODIUM CHLORIDE 0.9 % IV BOLUS
1000.0000 mL | Freq: Once | INTRAVENOUS | Status: AC
  Administered 2019-07-02: 1000 mL via INTRAVENOUS

## 2019-07-01 MED ORDER — ONDANSETRON HCL 4 MG/2ML IJ SOLN
4.0000 mg | Freq: Once | INTRAMUSCULAR | Status: AC
  Administered 2019-07-02: 4 mg via INTRAVENOUS
  Filled 2019-07-01: qty 2

## 2019-07-01 NOTE — ED Triage Notes (Signed)
Pt BIB [facility] for vomiting and altered LOC after consuming cannabis edibles.

## 2019-07-01 NOTE — ED Provider Notes (Signed)
[facility] DEPT Provider Note   CSN: [id number] Arrival date & time: 07/01/19  2220   History Chief Complaint  Patient presents with  . Ingestion    Karen Mcdaniel is a 29 y.o. female.  The history is provided by the EMS personnel. The history is limited by the condition of the patient (Altered mental status).  Ingestion  She arrived by ambulance after consuming cannabis edibles.  She apparently had altered mentation and vomiting.  Patient is not able to give any history.  Past Medical History:  Diagnosis Date  . Anemia   . Appendicitis   . Dyspnea    with exertion  . GERD (gastroesophageal reflux disease)   . Hard of hearing   . Headache   . History of kidney stones   . Obesity     There are no problems to display for this patient.   Past Surgical History:  Procedure Laterality Date  . APPENDECTOMY    . CHOLECYSTECTOMY    . LUNG BIOPSY    . MICROLARYNGOSCOPY WITH LASER AND BALLOON DILATION N/A 07/04/2016   Procedure: MICROLARYNGOSCOPY WITH LASER AND BALLOON DILATION;  Surgeon: Izora Gala, MD;  Location: [facility];  Service: ENT;  Laterality: N/A;  . TONSILLECTOMY       OB History   No obstetric history on file.     History reviewed. No pertinent family history.  Social History   Tobacco Use  . Smoking status: Never Smoker  . Smokeless tobacco: Never Used  Substance Use Topics  . Alcohol use: No  . Drug use: No    Home Medications Prior to Admission medications   Medication Sig Start Date End Date Taking? Authorizing Provider  Budesonide (PULMICORT FLEXHALER) 90 MCG/ACT inhaler Inhale 1 puff into the lungs 2 (two) times daily as needed (shortness of breath).    [provider]  docusate sodium (COLACE) 100 MG capsule Take 100 mg by mouth 2 (two) times daily.    [provider]  famotidine (PEPCID) 20 MG tablet Take 1 tablet (20 mg total) by mouth 2 (two) times daily. 03/04/17   Sherwood Gambler, MD    ferrous sulfate 325 (65 FE) MG tablet Take 325 mg by mouth 2 (two) times daily with a meal.    [provider]  ibuprofen (ADVIL,MOTRIN) 800 MG tablet Take 1 tablet (800 mg total) by mouth every 8 (eight) hours as needed for mild pain. Patient not taking: Reported on 05/03/2016 01/10/16   Ward, Delice Bison, DO  omeprazole (PRILOSEC) 40 MG capsule Take 40 mg by mouth 2 (two) times daily.    [provider]  ondansetron (ZOFRAN ODT) 4 MG disintegrating tablet Take 1 tablet (4 mg total) by mouth every 8 (eight) hours as needed for nausea or vomiting. 09/16/18   Khatri, Hina, PA-C  oxyCODONE-acetaminophen (PERCOCET/ROXICET) 5-325 MG tablet Take 1 tablet by mouth every 8 (eight) hours as needed for severe pain. 09/16/18   Khatri, Hina, PA-C  pantoprazole (PROTONIX) 40 MG tablet Take 1 tablet (40 mg total) by mouth 2 (two) times daily. 03/04/17   Sherwood Gambler, MD  predniSONE (STERAPRED UNI-PAK 21 TAB) 10 MG (21) TBPK tablet Take 3 tablets daily for 3 days, followed by 2 tablets daily for 3 days, followed by 1 tablet daily for the remainder. 07/04/16   Izora Gala, MD    Allergies    No known allergies  Review of Systems   Review of Systems  Unable to perform ROS: Mental status  change    Physical Exam Updated Vital Signs BP 130/76 (BP Location: Left Arm)   Pulse (!) 118   Temp 98.2 F (36.8 C) (Oral)   Resp 16   SpO2 95%   Physical Exam Vitals and nursing note reviewed.   29 year old female, resting comfortably and in no acute distress. Vital signs are significant for elevated heart rate. Oxygen saturation is 16%, which is normal. Head is normocephalic and atraumatic. PERRLA. Oropharynx is clear. Neck is nontender and supple without adenopathy or JVD. Back is nontender and there is no CVA tenderness. Lungs are clear without rales, wheezes, or rhonchi. Chest is nontender. Heart has regular rate and rhythm without murmur. Abdomen is soft, flat, nontender without masses or  hepatosplenomegaly and peristalsis is hypoactive. Extremities have no cyanosis or edema, full range of motion is present. Skin is warm and dry without rash. Neurologic: Somnolent, responds to deep pain, Is able to protect her airway cranial nerves are intact, there are no gross motor or sensory deficits.  ED Results / Procedures / Treatments   Labs (all labs ordered are listed, but only abnormal results are displayed) Labs Reviewed  COMPREHENSIVE METABOLIC PANEL - Abnormal; Notable for the following components:      Result Value   CO2 21 (*)    Glucose, Bld 165 (*)    Calcium 8.7 (*)    All other components within normal limits  CBC WITH DIFFERENTIAL/PLATELET - Abnormal; Notable for the following components:   WBC 21.0 (*)    RBC 5.66 (*)    Hemoglobin 11.5 (*)    MCV 67.8 (*)    MCH 20.3 (*)    MCHC 29.9 (*)    RDW 16.6 (*)    Neutro Abs 17.9 (*)    Abs Immature Granulocytes 0.37 (*)    All other components within normal limits  ETHANOL  RAPID URINE DRUG SCREEN, HOSP PERFORMED  I-STAT BETA HCG BLOOD, ED ([facility], [facility], [facility] ONLY)    EKG EKG Interpretation  Date/Time:  Wednesday July 01 2019 23:23:52 EST Ventricular Rate:  122 PR Interval:    QRS Duration: 82 QT Interval:  332 QTC Calculation: 473 R Axis:   90 Text Interpretation: Sinus tachycardia Borderline right axis deviation When compared with ECG of 05/03/2016, No significant change was found Confirmed by Dione Booze ([id number]) on 07/01/2019 11:26:32 PM  Procedures Procedures   Medications Ordered in ED Medications  ondansetron [facility]) injection 4 mg (4 mg Intravenous Given 07/02/19 0002)  sodium chloride 0.9 % bolus 1,000 mL (0 mLs Intravenous Stopped 07/02/19 0086)    ED Course  I have reviewed the triage vital signs and the nursing notes.  Pertinent labs & imaging results that were available during my care of the patient were reviewed by me and considered in my medical decision making (see chart for  details).  MDM Rules/Calculators/A&P Apparent overdose of cannabis edible with vomiting.  She will be given IV fluids and ondansetron.  Will check drug screen and ethanol level.  Old records are reviewed, and she has no relevant test results.  Labs show mild anemia.  ECG is unremarkable.  She was observed overnight.  On reexam, she is awake and alert and appears to be back to her baseline and is felt to be safe for discharge.  Final Clinical Impression(s) / ED Diagnoses Final diagnoses:  Accidental drug overdose, initial encounter    Rx / DC Orders ED Discharge Orders    None  Dione Booze, MD 07/02/19 [phone]

## 2019-07-02 LAB — COMPREHENSIVE METABOLIC PANEL
ALT: 15 U/L (ref 0–44)
AST: 22 U/L (ref 15–41)
Albumin: 3.9 g/dL (ref 3.5–5.0)
Alkaline Phosphatase: 79 U/L (ref 38–126)
Anion gap: 12 (ref 5–15)
BUN: 10 mg/dL (ref 6–20)
CO2: 21 mmol/L — ABNORMAL LOW (ref 22–32)
Calcium: 8.7 mg/dL — ABNORMAL LOW (ref 8.9–10.3)
Chloride: 107 mmol/L (ref 98–111)
Creatinine, Ser: 0.72 mg/dL (ref 0.44–1.00)
GFR calc Af Amer: >60 mL/min (ref >60)
GFR calc non Af Amer: >60 mL/min (ref >60)
Glucose, Bld: 165 mg/dL — ABNORMAL HIGH (ref 70–99)
Potassium: 3.6 mmol/L (ref 3.5–5.1)
Sodium: 140 mmol/L (ref 135–145)
Total Bilirubin: 0.5 mg/dL (ref 0.3–1.2)
Total Protein: 7.8 g/dL (ref 6.5–8.1)

## 2019-07-02 LAB — CBC WITH DIFFERENTIAL/PLATELET
Abs Immature Granulocytes: 0.37 10*3/uL — ABNORMAL HIGH (ref 0.00–0.07)
Basophils Absolute: 0.1 10*3/uL (ref 0.0–0.1)
Basophils Relative: 0 %
Eosinophils Absolute: 0 10*3/uL (ref 0.0–0.5)
Eosinophils Relative: 0 %
HCT: 38.4 % (ref 36.0–46.0)
Hemoglobin: 11.5 g/dL — ABNORMAL LOW (ref 12.0–15.0)
Immature Granulocytes: 2 %
Lymphocytes Relative: 8 %
Lymphs Abs: 1.6 10*3/uL (ref 0.7–4.0)
MCH: 20.3 pg — ABNORMAL LOW (ref 26.0–34.0)
MCHC: 29.9 g/dL — ABNORMAL LOW (ref 30.0–36.0)
MCV: 67.8 fL — ABNORMAL LOW (ref 80.0–100.0)
Monocytes Absolute: 1 10*3/uL (ref 0.1–1.0)
Monocytes Relative: 5 %
Neutro Abs: 17.9 10*3/uL — ABNORMAL HIGH (ref 1.7–7.7)
Neutrophils Relative %: 85 %
Platelets: 359 10*3/uL (ref 150–400)
RBC: 5.66 MIL/uL — ABNORMAL HIGH (ref 3.87–5.11)
RDW: 16.6 % — ABNORMAL HIGH (ref 11.5–15.5)
WBC: 21 10*3/uL — ABNORMAL HIGH (ref 4.0–10.5)
nRBC: 0 % (ref 0.0–0.2)

## 2019-07-02 LAB — ETHANOL: Alcohol, Ethyl (B): <10 mg/dL (ref <10)

## 2019-07-02 LAB — I-STAT BETA HCG BLOOD, ED (MC, WL, AP ONLY): I-stat hCG, quantitative: <5 m[IU]/mL (ref <5)

## 2019-07-02 NOTE — ED Notes (Signed)
Patient was verbalized discharge instructions. Pt had no further questions at this time. NAD. 

## 2019-07-02 NOTE — Discharge Instructions (Addendum)
Be very careful when using cannibis edibles - there is a lag time from when you eat it and when the drug gets in your system. It is very easy to eat too much. That is what happened to you last night. 

## 2019-08-06 IMAGING — CT CT RENAL STONE PROTOCOL
2 of 4 series · 16 of 46 positions shown, 18 images · non-contrast
Comparison: CT scan dated 01/10/2016

CLINICAL DATA: Right-sided pelvic pain with nausea, vomiting, and
diarrhea. Flank pain.

EXAM:
CT ABDOMEN AND PELVIS WITHOUT CONTRAST
TECHNIQUE: Multidetector CT imaging of the abdomen and pelvis was performed
following the standard protocol without IV contrast.

[Series 3: renal stone 5.0 · axial · 0.86mm/px · z∈[+730,+1155]mm · 13 of 93 slices shown, 15 images]
[im 4/93  soft-tissue]
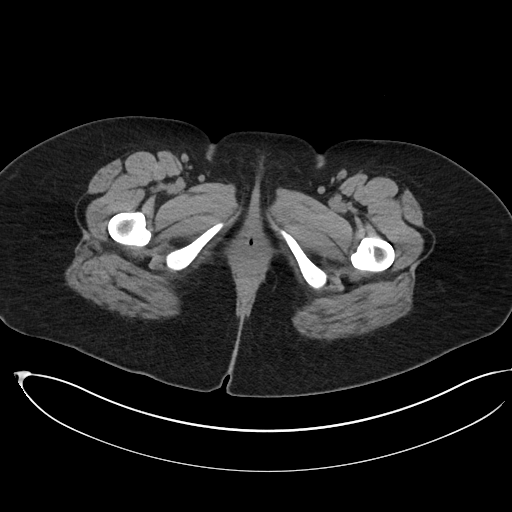
[im 4/93  bone]
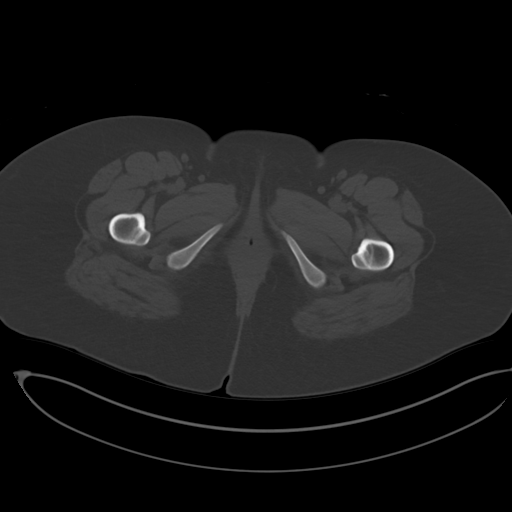
[im 12/93  soft-tissue]
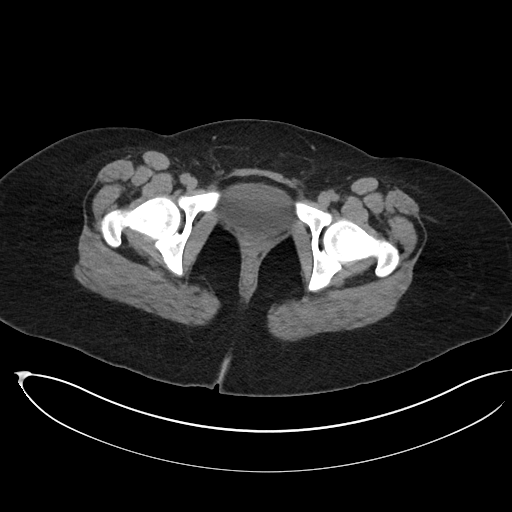
[im 20/93  soft-tissue]
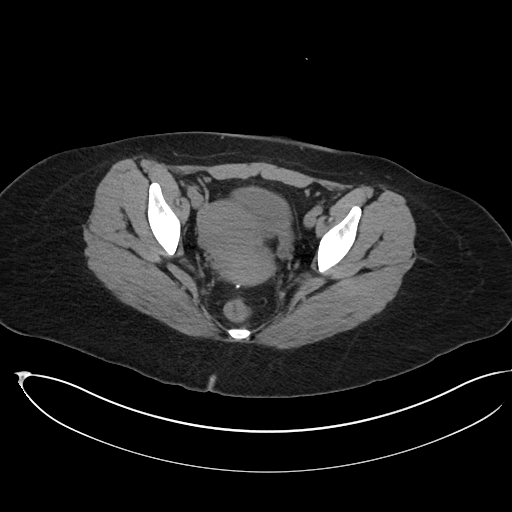
[im 27/93  soft-tissue]
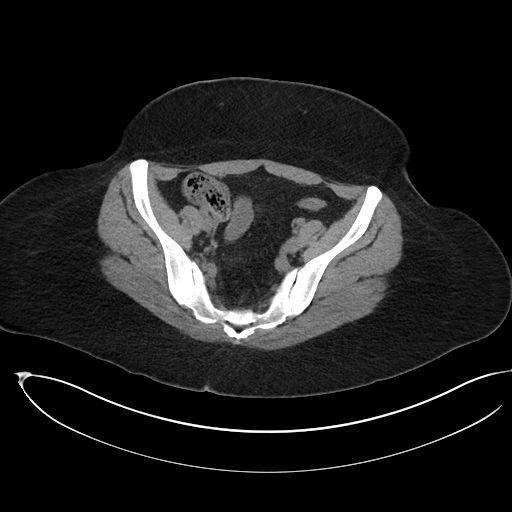
[im 31/93  soft-tissue]
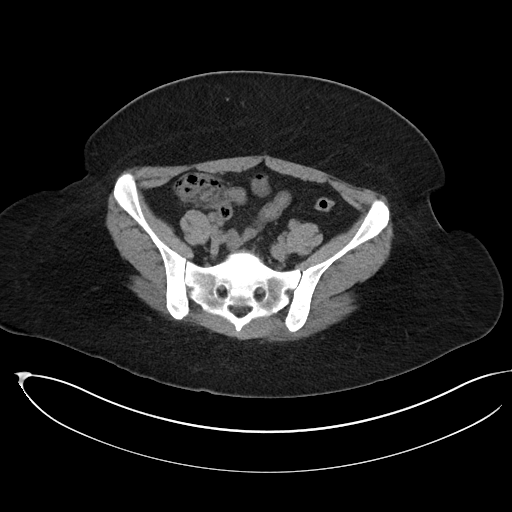
[im 39/93  soft-tissue]
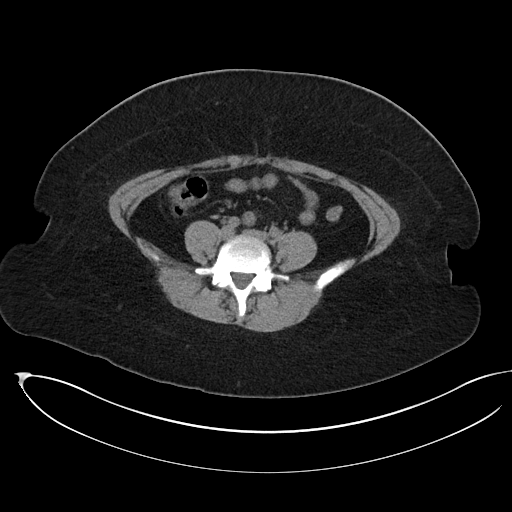
[im 47/93  soft-tissue]
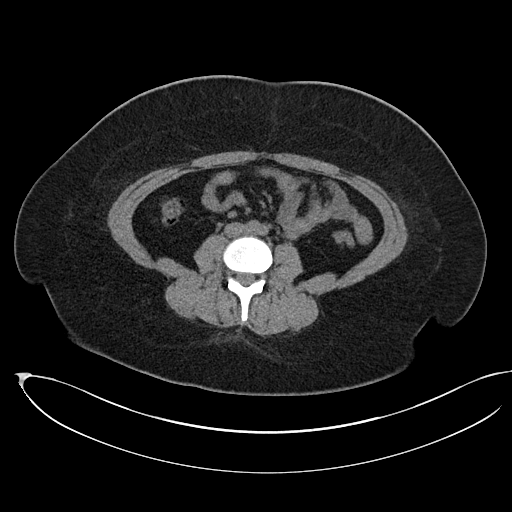
[im 54/93  soft-tissue]
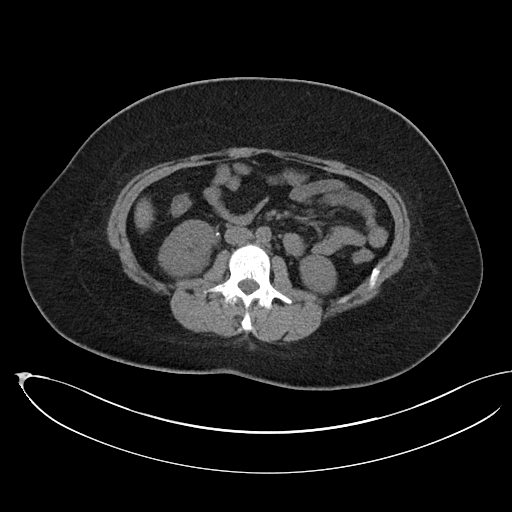
[im 62/93  soft-tissue]
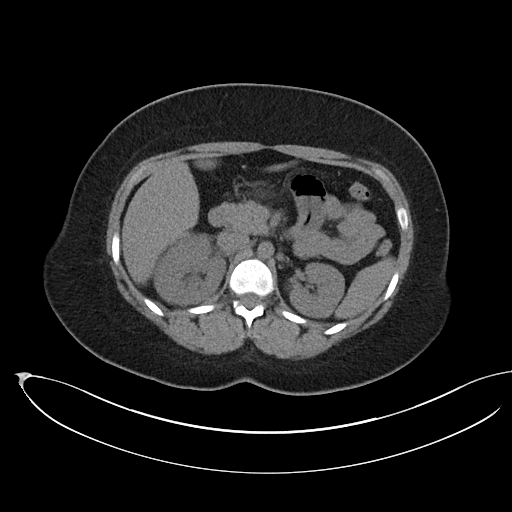
[im 62/93  bone]
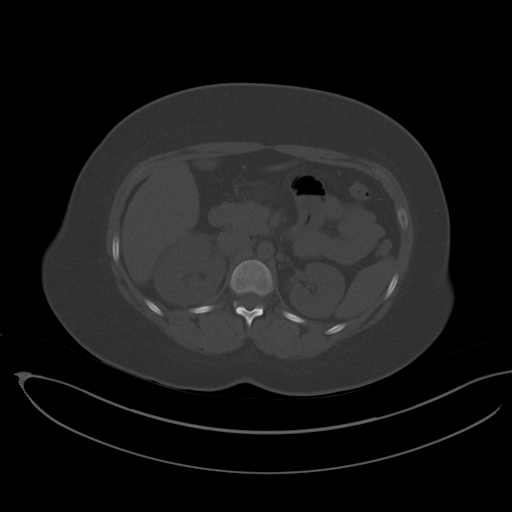
[im 66/93  soft-tissue]
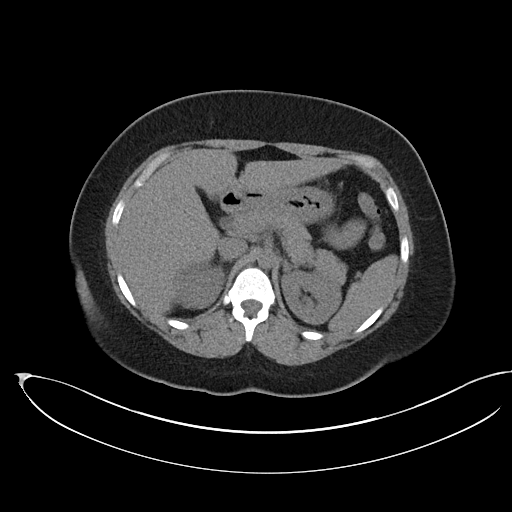
[im 73/93  soft-tissue]
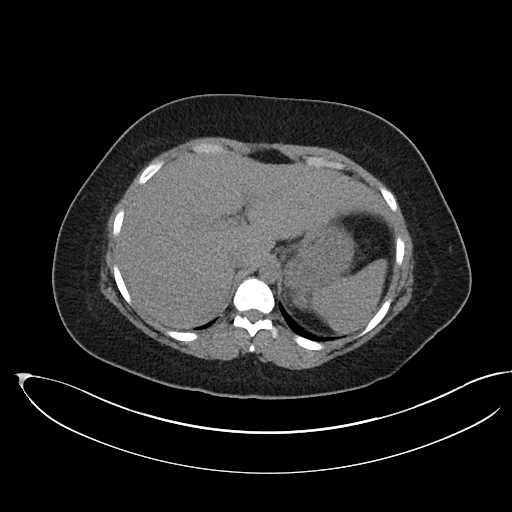
[im 81/93  soft-tissue]
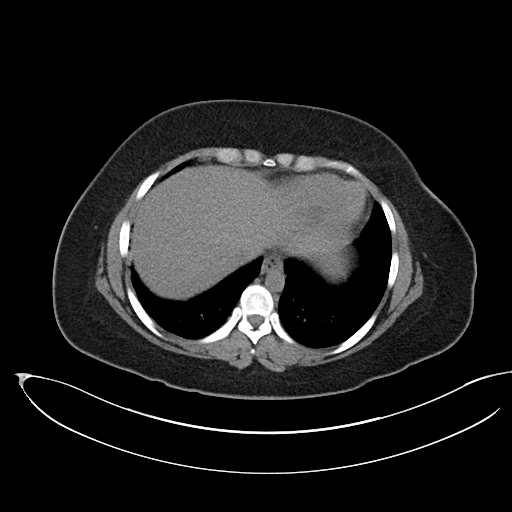
[im 89/93  soft-tissue]
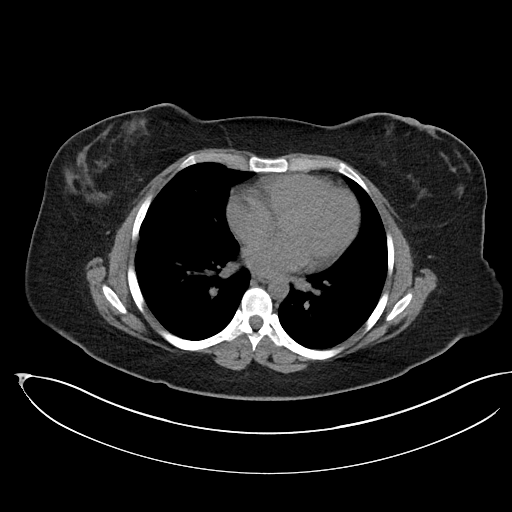

[Series 6: renal stone 3.0 cor · coronal · 0.76mm/px · 3 of 99 slices shown]
[im 33/99  soft-tissue]
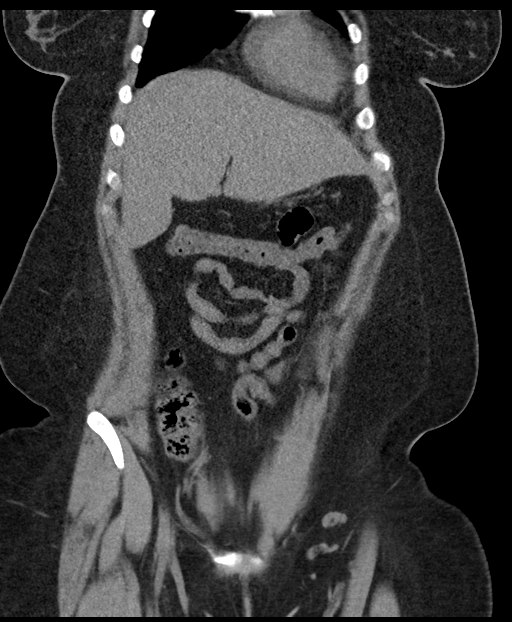
[im 44/99  soft-tissue]
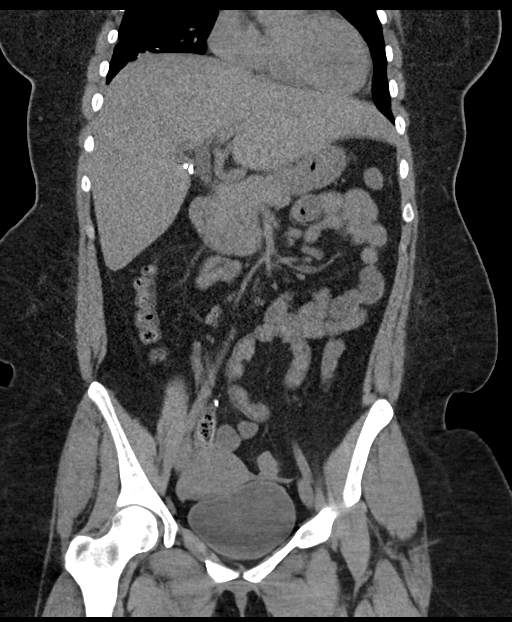
[im 55/99  soft-tissue]
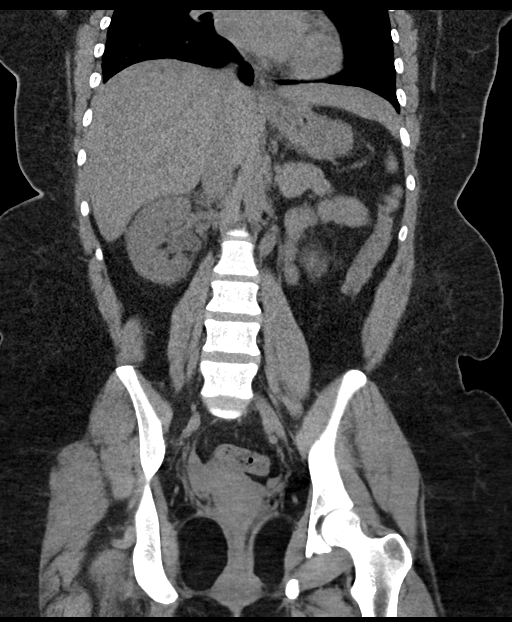

[16 of 46 positions shown; findings below may reference images not displayed]

FINDINGS: Lower chest: No acute abnormality. Chronic scarring at the right
lung base anteriorly, unchanged.

Hepatobiliary: No focal liver abnormality is seen. Status post
cholecystectomy. No biliary dilatation.

Pancreas: Unremarkable. No pancreatic ductal dilatation or
surrounding inflammatory changes.

Spleen: Normal in size without focal abnormality.

Adrenals/Urinary Tract: There is a 5 mm stone obstructing the right
ureter just below the right renal pelvis creating minimal right
hydronephrosis. No right renal calculi. Left kidney and adrenal
glands are normal. Bladder is normal.

Stomach/Bowel: Stomach is within normal limits. Appendix has been
removed. No evidence of bowel wall thickening, distention, or
inflammatory changes.

Vascular/Lymphatic: No significant vascular findings are present. No
enlarged abdominal or pelvic lymph nodes.

Reproductive: Uterus and bilateral adnexa are unremarkable.

Other: No abdominal wall hernia or abnormality. No abdominopelvic
ascites.

Musculoskeletal: No acute or significant osseous findings.
IMPRESSION: 1. 5 mm stone obstructing the proximal right ureter creating mild
right hydronephrosis.
2. Otherwise benign appearing abdomen and pelvis.

## 2020-03-28 ENCOUNTER — Encounter: Payer: Self-pay | Admitting: Obstetrics & Gynecology

## 2020-03-28 ENCOUNTER — Other Ambulatory Visit: Payer: Self-pay

## 2020-03-28 ENCOUNTER — Ambulatory Visit (INDEPENDENT_AMBULATORY_CARE_PROVIDER_SITE_OTHER): Payer: Medicaid Other | Admitting: Obstetrics & Gynecology

## 2020-03-28 ENCOUNTER — Other Ambulatory Visit (HOSPITAL_COMMUNITY)
Admission: RE | Admit: 2020-03-28 | Discharge: 2020-03-28 | Disposition: A | Discharge status: Home / Self Care | Payer: Medicaid Other | Source: Ambulatory Visit | Attending: Obstetrics & Gynecology | Admitting: Obstetrics & Gynecology

## 2020-03-28 VITALS — BP 122/76 | HR 87 | Wt 174.9 lb

## 2020-03-28 DIAGNOSIS — O0990 Supervision of high risk pregnancy, unspecified, unspecified trimester: Secondary | ICD-10-CM

## 2020-03-28 DIAGNOSIS — Z9889 Other specified postprocedural states: Secondary | ICD-10-CM

## 2020-03-28 DIAGNOSIS — Z3400 Encounter for supervision of normal first pregnancy, unspecified trimester: Secondary | ICD-10-CM

## 2020-03-28 LAB — POCT URINALYSIS DIP (DEVICE)
Bilirubin Urine: NEGATIVE
Glucose, UA: NEGATIVE mg/dL
Hgb urine dipstick: NEGATIVE
Ketones, ur: 15 mg/dL — AB
Nitrite: NEGATIVE
Protein, ur: NEGATIVE mg/dL
Specific Gravity, Urine: >=1.03 (ref 1.005–1.030)
Urobilinogen, UA: 0.2 mg/dL (ref 0.0–1.0)
pH: 6.5 (ref 5.0–8.0)

## 2020-03-28 MED ORDER — BLOOD PRESSURE KIT DEVI: 1.0000 | Freq: Once | 0 refills | Status: AC

## 2020-03-28 MED ORDER — BLOOD PRESSURE KIT DEVI: 1.0000 | Freq: Once | 0 refills | Status: DC

## 2020-03-28 NOTE — Progress Notes (Signed)
  Subjective:late to care    Karen Mcdaniel is a G1P0000 [redacted]w[redacted]d being seen today for her first obstetrical visit.  Her obstetrical history is significant for late to care, several ENT issues. Patient does intend to breast feed. Pregnancy history fully reviewed.  Patient reports no complaints.  Vitals:   03/28/20 1026  BP: 122/76  Pulse: 87  Weight: 174 lb 14.4 oz (79.3 kg)    HISTORY: OB History  Gravida Para Term Preterm AB Living  1 0 0 0 0 0  SAB TAB Ectopic Multiple Live Births  0 0 0 0 0    # Outcome Date GA Lbr Len/2nd Weight Sex Delivery Anes PTL Lv  1 Current            Past Medical History:  Diagnosis Date  . Anemia   . Appendicitis   . Dyspnea    with exertion  . GERD (gastroesophageal reflux disease)   . Hard of hearing   . Headache   . History of kidney stones   . Obesity    Past Surgical History:  Procedure Laterality Date  . APPENDECTOMY    . CHOLECYSTECTOMY    . LUNG BIOPSY    . MICROLARYNGOSCOPY WITH LASER AND BALLOON DILATION N/A 07/04/2016   Procedure: MICROLARYNGOSCOPY WITH LASER AND BALLOON DILATION;  Surgeon: Serena Colonel, MD;  Location: [facility] OR;  Service: ENT;  Laterality: N/A;  . TONSILLECTOMY     History reviewed. No pertinent family history.   Exam    Uterus:   20 week  Pelvic Exam:    Perineum: No Hemorrhoids   Vulva: normal   Vagina:  normal mucosa   pH:     Cervix: no lesions   Adnexa: not evaluated   Bony Pelvis: average  System: Breast:  normal appearance, no masses or tenderness   Skin: normal coloration and turgor, no rashes    Neurologic: oriented, normal mood   Extremities: normal strength, tone, and muscle mass   HEENT PERRLA   Mouth/Teeth    Neck supple   Cardiovascular: regular rate and rhythm, no murmurs or gallops   Respiratory:  appears well, vitals normal, no respiratory distress, acyanotic, normal RR, neck free of mass or lymphadenopathy, chest clear, no wheezing, crepitations, rhonchi, normal symmetric air  entry   Abdomen: gravid   Urinary: urethral meatus normal      Assessment:    Pregnancy: G1P0000 Patient Active Problem List   Diagnosis Date Noted  . Supervision of high risk pregnancy, antepartum 03/28/2020  . History of tracheostomy 11/12/2018  . Iron deficiency anemia due to chronic blood loss 05/13/2017  . Bilateral hearing loss 01/24/2017  . 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 Uncomplicated asthma 02/25/2015  . Esophageal reflux 04/14/2007        Plan:     Initial labs drawn. Prenatal vitamins. Problem list reviewed and updated. Genetic Screening discussed Animal nutritionist and Horizon, AFP  Ultrasound discussed; fetal survey: ordered.  Follow up in 4 weeks. 50% of 30 min visit spent on counseling and coordination of care.  Consider anesthesia consult   Scheryl Darter 03/28/2020

## 2020-03-28 NOTE — Patient Instructions (Signed)
AREA PEDIATRIC/FAMILY PRACTICE PHYSICIANS  [place] ([address]) . [facility] o Chambliss, MD; Eniola, MD; Hale, MD; Hensel, MD; McDiarmid, MD; McIntyer, MD; Neal, MD; Walden, MD o [address] [place], [place] [address] o [phone] o Mon-Fri 8:30-12:30, 1:30-5:00 o Providers come to see babies at [facility] o Accepting Medicaid . [facility] o Limited providers who accept newborns: Koirala, MD; Morrow, MD; Wolters, MD o [address], [place], [place] [address] o [phone] o Mon-Fri 8:00-5:30 o Babies seen by providers at [facility] o Does NOT accept Medicaid o Please call early in hospitalization for appointment (limited availability)  . [facility] o Mulberry, MD o [address] [place], [place] [address] o [phone] o Mon, Tue, Thur, Fri 8:30-5:00, Wed 10:00-7:00 (closed 1-2pm) o Babies seen by [facility] providers o Accepting Medicaid . [facility] o Rubin, MD o [address], [place], [place] [address] o [phone] o Mon-Fri 8:30-5:00, Sat 8:30-12:00 o Provider comes to see babies at [facility] o Accepting Medicaid o Must have been referred from current patients or contacted office prior to delivery . [facility] ([facility]) o Brown, MD; Chandler, MD; Ettefagh, MD; Grant, MD; Lester, MD; McCormick, MD; McQueen, MD; Prose, MD; Simha, MD; Stanley, MD; Stryffeler, NP; Tebben, NP o [address], [place], [place] [address] o [phone] o Mon, Tue, Thur, Fri 8:30-5:30, Wed 9:30-5:30, Sat 8:30-12:30 o Babies seen by [facility] providers o Accepting Medicaid o Only accepting infants of first-time parents or siblings of current patients o Hospital discharge coordinator will make follow-up appointment . Jack Amos o [address],  [place], [place]  [address] o [phone]   Fax - [phone] . [facility] o [address], [place], [place]  [address] o Phone - [phone]   Fax - [phone] . Shilpa Gosrani o [address], [place], [place]  [address] o [phone]  [place] ([address]) . [facility] o Bates, MD; [place], MD; Cooper, Cox, MD; MD; Davis, MD; Dovico, MD; Ettefaugh, MD; Little, MD; Lowe, MD; Keiffer, MD; Melvin, MD; Sumner, MD; Williams, MD o [address], [place], [place] [address] o [phone] o Mon-Fri 8:30-5:00 (extended evenings Mon-Thur as needed), Sat-Sun 10:00-1:00 o Providers come to see babies at [facility] o Accepting Medicaid for families of first-time babies and families with all children in the household age 3 and under. Must register with office prior to making appointment (M-F only). . [facility] o Henson, NP; Knapp, MD; Lalonde, MD; Tysinger, PA o [address] [place],  [address] o [phone] o Mon-Fri 8:00-5:00 o Babies seen by providers at [facility] o Does NOT accept Medicaid/Commercial Insurance Only . [facility] ([facility]  o Artis, MD; Barnes, MD; Bratton, MD; Coccaro, MD; Lockett Gardner, MD; Kramer, MD; Marshall, MD; Netherton, MD; Poleto, MD; Skinner, MD o [address] [place],  [address] o [phone] o Mon-Fri 8:30-5:30, Sat (Oct.-Mar.) 9:00-1:00 o Babies seen by providers at [facility] o Accepting Medicaid  [place] ([address]) . [facility] o Reid, MD; Warner, MD o [address], ,  [address] o [phone] o Mon-Fri 8:30-5:00, Sat 8:30-12:00 o Providers come to see babies at [facility] o Does NOT accept Medicaid . [facility] o Becker, PA; Hagler, MD; Scifres, PA; Sun, MD; Swayne, MD o [address],  [place], [place] [address] o [phone] o Mon-Fri 8:00-5:00 o Babies seen by providers at [facility] o Does NOT accept Medicaid o Only accepting babies of parents who are patients o Please call early in hospitalization for appointment (limited availability) . [facility] o Clark, MD; Frye, MD; Kelleher, MD; Mack, NP; Miller, MD; O'Keller, MD; [place], NP; Pudlo, MD; Puzio, MD; Thomas, MD; Tucker, MD; Twiselton, MD o [address], [place], [place] [address] o [phone] o Mon-Fri 8:00-5:00, Sat 9:00-12:00 o Providers come to see babies at [facility] o Does NOT accept Medicaid  [place] ([address]) . [facility] o Limited providers accepting new patients: Brake, NP; Wharton, PA o [address], [place], [place] [address] o [phone] o Mon-Fri 8:00-5:00 o Babies seen by providers at [facility] o Does NOT accept Medicaid o Only accepting babies of parents who are patients o Please call early in hospitalization for appointment (limited availability) . [facility] o Gay, MD; Quinlan, MD o [address] [place], [place] [address] o [phone] (press [phone] to schedule appointment) o Mon-Fri 8:00-5:00 o Providers come to see babies at [facility] o Does NOT accept Medicaid . [facility] o Mazer, MD o [address] [place], [place] [address] o [phone] o Mon-Fri 8:30-5:00 (lunch 12:30-1:00), extended hours by appointment only Wed 5:00-6:30 o Babies seen by [facility] providers o Accepting Medicaid . [facility] o Banks, MD; Jordan, MD; Koberlein, MD o [address], [place], [place] [address] o [phone] o Mon-Fri 8:00-5:00 o Babies seen by [facility] providers o Does NOT accept Medicaid .  [facility] o Parker, MD; Hunter, MD; Wallace, DO o [address] [place], [place]  [address] o [phone] o Mon-Fri 8:00-5:00 o Babies seen by [facility] providers o Does NOT accept Medicaid . [facility] o Brandon, PA; Brecken, PA; Christy, NP; Dees, MD; DeClaire, MD; DeWeese, MD; Hansen, NP; Mills, NP; Parrish, NP; Smoot, NP; Summer, MD; Vapne, MD o [address] [place], [place] [address] o [phone] o Mon-Fri 8:30-5:00, Sat 10:00-1:00 o Providers come to see babies at [facility] o Does NOT accept Medicaid o Free prenatal information session Tuesdays at 4:45pm . [facility] o Bouska, MD; Gordon, PA; Jeffery, PA; Weber, PA o [address] [place] [address] o [phone] o Mon-Fri 7:30-5:30 o Babies seen by [facility] providers . [facility] o [address], [place], [place]  [address] o [phone]   Fax - [phone]  [place] ([address] & [address]) . [facility] o [place], [place] o [address] [place], [place] [address] o [phone] o Mon-Thur 8:00-6:00 o Providers come to see babies at [facility] o Accepting Medicaid . [facility] o Anderson, NP; Badger, MD; Beal, PA; Spencer, PA o [address] [place],  [address] o [phone] o Mon-Thur 7:30-7:30, Fri 7:30-4:30 o Babies seen by [facility] providers o Accepting Medicaid . [facility] o Agbuya, MD; Klett, NP; Romgoolam, MD o [address], [place],  [address] o [phone] o Mon-Fri 8:30-5:00, Sat 8:30-12:00 o Providers come to see babies at [facility] o Accepting Medicaid o Must have "Meet & Greet" appointment at office prior to delivery . [facility] o McCord,   MD; Wallace, MD; Wood, MD o [address], [place], [place] [address] o [phone] o Mon-Wed 8:00-6:00, Thur-Fri 8:00-5:00, Sat 9:00-12:00 o Providers come to  see babies at [facility] o Does NOT accept Medicaid o Only accepting siblings of current patients . [facility]  o [address], [place], [place]  [address] o [phone]   Fax - [phone] . [facility] o [address], [place], [place]  [address] o [phone]   Fax - [phone]  [place] ([address] & [address]) . [facility] o Cirigliano, DO; Matthews, DO o [address] , [place] [address] o [phone] o Mon-Fri 7:00-5:00 o Babies seen by [facility] providers o Does NOT accept Medicaid . [facility] o Briscoe, MD; Howley, PA; Moreira, PA o [address], [place], [place] [address] o [phone] o Mon-Fri 8:00-5:00 o Babies seen by [facility] providers o Accepting Medicaid . [facility] o Boyd, MD; Church, PA; Jones, NP; Osborn, PA o [address], , [place] [address] o [phone] o Mon-Fri 8:00-5:00 o Babies seen by providers at [facility] o Accepting Medicaid  [place] ([address]) . [facility] [place] o Wendling, DO o [address] [place], [place] [address] o [phone] o Mon-Fri 8:00-5:00 o Babies seen by [facility] providers o Does NOT accept Medicaid o Limited availability, please call early in hospitalization to schedule follow-up . [facility] o Calderon, PA; Cummings, MD; Dillard, MD; Martin, PA; Olson, MD; VanDeven, PA o [address], [place], [place] [address] o [phone] o Mon-Fri 8:30-5:00, Sat 9:00-12:00 o Babies seen by providers at [facility] o Accepting Medicaid o Please register online then schedule online or call office o [email or web address] . [facility] o Hunter, NP; Kumar, MD; Martin Rogers, PA o [address], [place], [place] [address] o [phone] o Mon-Fri 8:00-5:00 o Babies seen by providers at [facility] o Accepting Medicaid . [facility] o Zavala, MD; Kristi Fleenor, NP; West, MD o [address], [place], [place] [address] o [phone] o Mon-Fri 8:00-5:30, Sat&Sun by appointment (phones open at 8:30) o Babies seen by [facility] providers o Accepting Medicaid o Must be a first-time baby or sibling of current patient . [facility] [place]  o [address], [place], [place]  [address] o [phone]   Fax - [phone]  [place] ([address]) . [facility] o Brown, PA; Cowen, PA; Rice, MD; Helton, PA; Spry, MD o [address] [place],  [address] o [phone] o Mon-Thur 8:00-7:00, Fri 8:00-5:00, Sat 8:00-12:00, Sun 9:00-12:00 o Babies seen by [facility] providers o Accepting Medicaid . [facility] [place] o Coe-Goins, MD; Marshall, MD; Pierre-Louis, MD o [address] [place] [address], [place],  [address] o [phone] o Mon-Thur 8:00-5:00 o Babies seen by providers at [facility] o Accepting Medicaid . [facility] o Bratton, MD; Coe-Goins, MD; Hayes, MD; Lewis, MD; List, MD; Lott, MD; Marshall, MD; Moran, MD; O'Neal, MD; Pierre-Louis, MD; Pitonzo, MD; Scholer, MD; Spangle, MD o [address] [place],    [address] o [phone] o Mon-Fri 8:00-5:30, Sat (Oct.-Mar.) 9:00-1:00 o Babies seen by providers at [facility] o Accepting Medicaid o Must fill out new patient packet, available online at [email or web address] . [facility] ([facility] o Friddle, NP; Harris, NP; Kelly, NP; Logan, MD;  Melvin, PA; Poth, MD; Ramadoss, MD; Stanton, NP o [address], [place], [place] [address] o [phone] o Mon-Thur 8:00-5:30, Fri 8:00-5:00 o Babies seen by providers at [facility] o Accepting Medicaid  [place] ([address]) . [facility] o Dixon, PA; [place], MD; Pickard, MD; Tapia, PA o [address], [place], [place] [address] o [phone] o Mon-Fri 8:00-5:00 o Babies seen by providers at [facility] o Accepting Medicaid   [place] ([address]) . [facility] [place] o Masneri, DO; Meyers, MD; Nelson, PA o [address], [place], [place] [address] o [phone] o Mon-Fri 8:00-5:00 o Babies seen by providers at [facility] o Does NOT accept Medicaid o Limited appointment availability, please call early in hospitalization  . [facility] [place] o Kunedd, DO; McGowen, MD o [address], [place], [place] [address] o [phone] o Mon-Fri 8:00-5:00 o Babies seen by [facility] providers o Does NOT accept Medicaid . [facility] [place] o Cameron, MD; MacDonald, MD; Michaels, PA; Nayak, MD o [address] [place] [address], [place], [place] [address] o [phone] o Mon-Fri 8:00-5:00 o After hours clinic ([address] [place], [place] [address]) [phone] Mon-Fri 5:00-8:00, Sat 12:00-6:00, Sun 10:00-4:00 o Babies seen by [facility] providers o Accepting Medicaid . [facility] [place] o [address], [place], [place]  [address] o [phone]   Fax - [phone]  [place] ([address]) . [facility] [place] [facility] o Andy, MD o [address], [place], [place] [address] o [phone] o Mon-Fri 8:00-5:00 o Babies seen by [facility] providers o Does NOT accept Medicaid . [facility] [place] ([facility] [place]) o Eksir, MD o [address], [place], [place]  [address] o [phone] o Mon-Thur 8:00-7:00, Fri 8:00-5:00, Sat 8:00-12:00 o Babies seen by providers at [facility] o Accepting Medicaid - but does not have vaccinations in office (must be received elsewhere) o Limited availability, please call early in hospitalization  [place] ([address]) . [facility]  o Charlene Flemming, MD o [address], [place] [address] o [phone]  Fax [phone]   

## 2020-03-30 ENCOUNTER — Encounter: Payer: Self-pay | Admitting: *Deleted

## 2020-03-30 LAB — ABO AND RH: Rh Factor: POSITIVE

## 2020-03-30 LAB — AFP, SERUM, OPEN SPINA BIFIDA
AFP MoM: 1.05
AFP Value: 56.3 ng/mL
Gest. Age on Collection Date: 20.2 weeks
Maternal Age At EDD: 27.5 yr
OSBR Risk 1 IN: 10000
Test Results:: NEGATIVE
Weight: 174 [lb_av]

## 2020-03-30 LAB — RPR: RPR Ser Ql: NONREACTIVE

## 2020-04-04 LAB — CYTOLOGY - PAP
Chlamydia: NEGATIVE
Comment: NEGATIVE
Comment: NORMAL
Diagnosis: NEGATIVE
Neisseria Gonorrhea: NEGATIVE

## 2020-04-11 ENCOUNTER — Encounter: Payer: Self-pay | Admitting: *Deleted

## 2020-04-12 ENCOUNTER — Other Ambulatory Visit: Payer: Self-pay | Admitting: *Deleted

## 2020-04-12 ENCOUNTER — Encounter: Payer: Self-pay | Admitting: *Deleted

## 2020-04-12 ENCOUNTER — Ambulatory Visit: Payer: Medicaid Other | Attending: Obstetrics & Gynecology

## 2020-04-12 ENCOUNTER — Other Ambulatory Visit: Payer: Self-pay

## 2020-04-12 ENCOUNTER — Ambulatory Visit: Payer: Medicaid Other | Admitting: *Deleted

## 2020-04-12 DIAGNOSIS — Z362 Encounter for other antenatal screening follow-up: Secondary | ICD-10-CM

## 2020-04-12 DIAGNOSIS — O0990 Supervision of high risk pregnancy, unspecified, unspecified trimester: Secondary | ICD-10-CM

## 2020-04-13 ENCOUNTER — Telehealth: Payer: Self-pay | Admitting: Lactation Services

## 2020-04-13 NOTE — Telephone Encounter (Signed)
Called patient to inform her of Administrator.   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 Reviewed that some patients who are carriers can have some mild anemia. Informed patient it is recommended she call [employer] at [phone] to set up a Telephone Genetic Counseling Session to discuss results. Reviewed they will recommend Father of baby also be tested to see if he carries the same gene.   Patient voiced understanding and has no further questions or concerns at this time.

## 2020-04-21 ENCOUNTER — Encounter: Payer: Self-pay | Admitting: *Deleted

## 2020-04-21 NOTE — Progress Notes (Signed)
Received notification from [employer] from 04/19/20 they have been unable to reach patient to schedule [employer] genetic information session.  Cassidy Tabet,RN

## 2020-04-25 ENCOUNTER — Other Ambulatory Visit: Payer: Self-pay

## 2020-04-25 ENCOUNTER — Encounter: Payer: Self-pay | Admitting: Obstetrics & Gynecology

## 2020-04-25 ENCOUNTER — Ambulatory Visit (INDEPENDENT_AMBULATORY_CARE_PROVIDER_SITE_OTHER): Payer: Medicaid Other | Admitting: Obstetrics & Gynecology

## 2020-04-25 DIAGNOSIS — O0990 Supervision of high risk pregnancy, unspecified, unspecified trimester: Secondary | ICD-10-CM

## 2020-04-25 NOTE — Progress Notes (Signed)
   PRENATAL VISIT NOTE  Subjective:  Jullia Canova is a 29 y.o. G1P0000 at [redacted]w[redacted]d being seen today for ongoing prenatal care.  She is currently monitored for the following issues for this low-risk pregnancy and has Supervision of high risk pregnancy, antepartum; Bilateral hearing loss; Esophageal reflux; Uncomplicated asthma; History of tracheostomy; Iron deficiency anemia due to chronic blood loss; and Anterior glottic web on their problem list.  Patient reports no complaints.  Contractions: Not present. Vag. Bleeding: None.  Movement: Present. Denies leaking of fluid.   The following portions of the patient's history were reviewed and updated as appropriate: allergies, current medications, past family history, past medical history, past social history, past surgical history and problem list.   Objective:   Vitals:   04/25/20 1031  BP: 115/67  Pulse: 86  Weight: 176 lb 6.4 oz (80 kg)    Fetal Status: Fetal Heart Rate (bpm): 152   Movement: Present     General:  Alert, oriented and cooperative. Patient is in no acute distress.  Skin: Skin is warm and dry. No rash noted.   Cardiovascular: Normal heart rate noted  Respiratory: Normal respiratory effort, no problems with respiration noted  Abdomen: Soft, gravid, appropriate for gestational age.  Pain/Pressure: Absent     Pelvic: Cervical exam deferred        Extremities: Normal range of motion.  Edema: None  Mental Status: Normal mood and affect. Normal behavior. Normal judgment and thought content.   Assessment and Plan:  Pregnancy: G1P0000 at [redacted]w[redacted]d 1. Supervision of high risk pregnancy, antepartum Pt considering moving back to [place] where she has more family support.   Preterm labor symptoms and general obstetric precautions including but not limited to vaginal bleeding, contractions, leaking of fluid and fetal movement were reviewed in detail with the patient. Please refer to After Visit Summary for other counseling  recommendations.   No follow-ups on file.  Future Appointments  Date Time Provider Department Center  05/11/2020 12:30 PM [facility] NURSE [facility]  05/11/2020 12:45 PM [facility] US5 [facility]  05/23/2020  8:15 AM Warden Fillers, MD [facility]  05/23/2020  8:50 AM [facility] LAB [facility]    Malachy Chamber, MD Patient ID: [place], female   DOB: Dec 08, 1990, 29 y.o.   MRN: [id number]

## 2020-04-25 NOTE — Patient Instructions (Signed)
Blood pressure cuff [facility]  [address], [place] [address]  Phone: [phone] 

## 2020-05-11 ENCOUNTER — Ambulatory Visit: Payer: Medicaid Other

## 2020-05-23 ENCOUNTER — Other Ambulatory Visit: Payer: Medicaid Other

## 2020-05-23 ENCOUNTER — Encounter: Payer: Medicaid Other | Admitting: Obstetrics and Gynecology

## 2020-05-24 ENCOUNTER — Encounter: Payer: Medicaid Other | Admitting: Medical

## 2020-05-24 ENCOUNTER — Other Ambulatory Visit: Payer: Medicaid Other

## 2021-03-02 IMAGING — US US MFM OB DETAIL+14 WK
1 series · 13 of 28 positions shown · non-contrast
Comparison: none

[Series 1: us mfm ob detail+14 wk · 71 acquisitions, 13 frames shown]
[im 3/71]
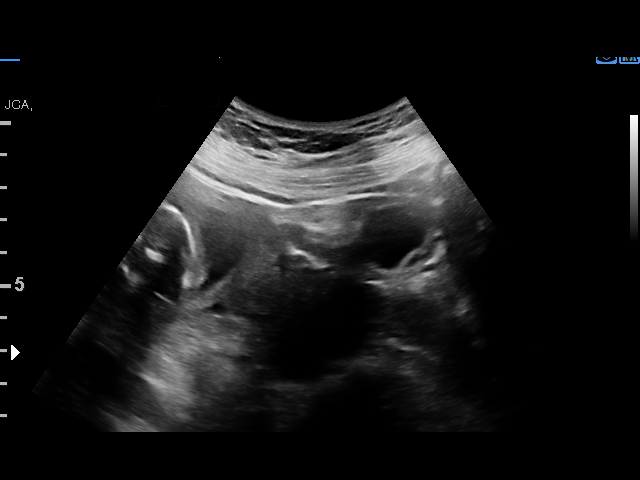
[im 8/71]
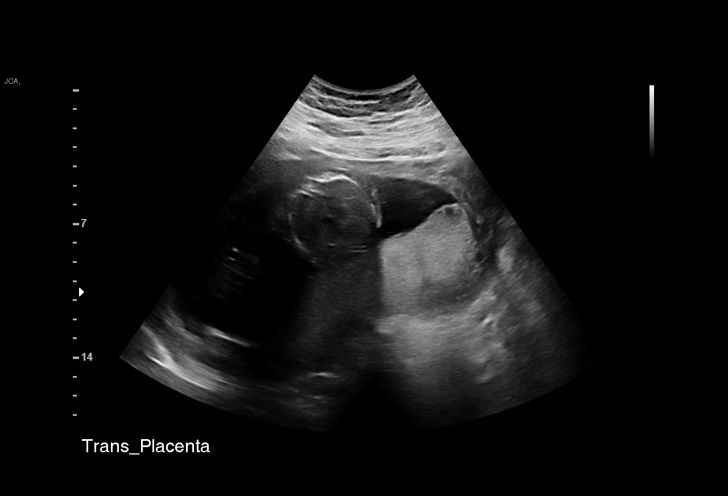
[im 13/71]
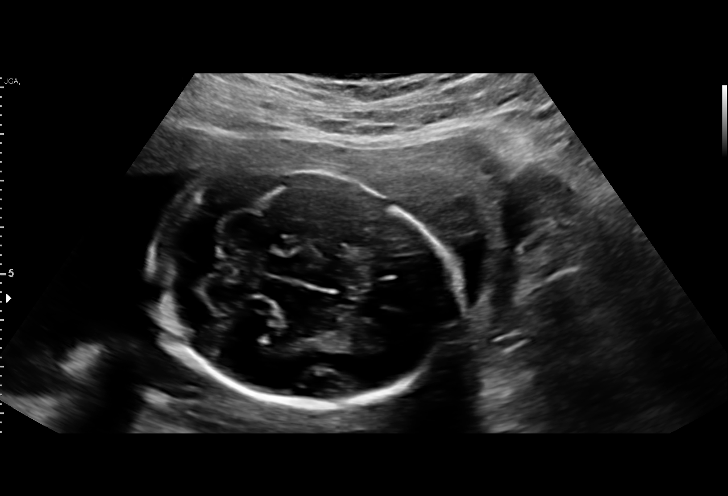
[im 19/71]
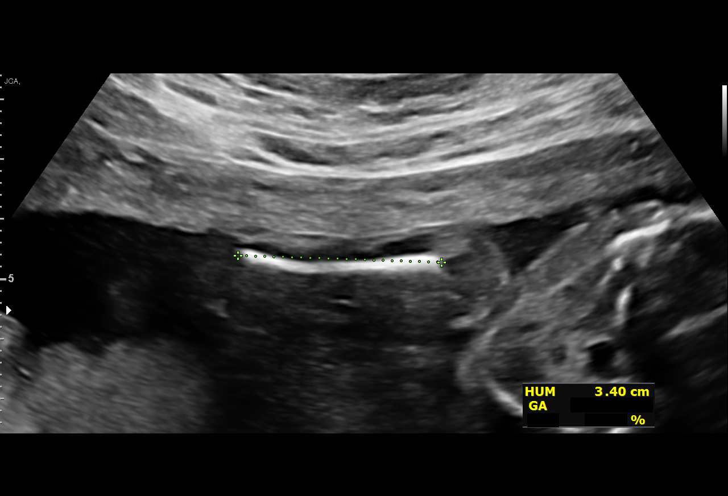
[im 24/71]
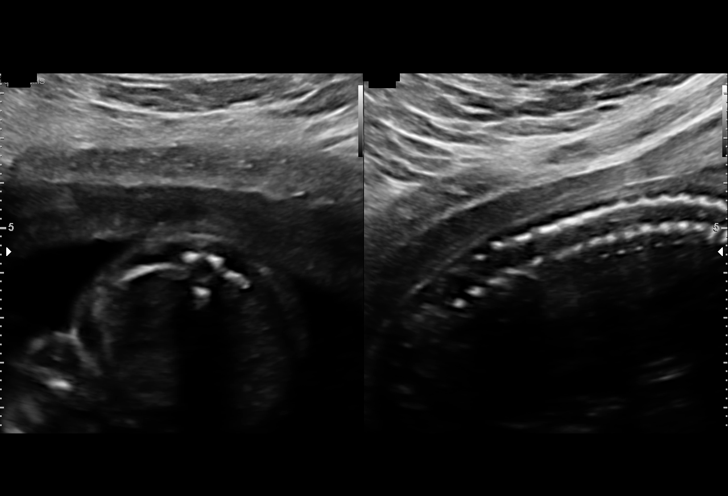
[im 29/71]
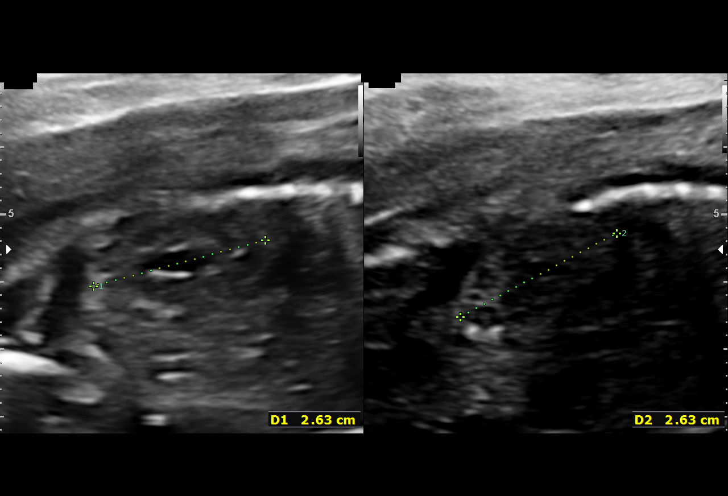
[im 37/71]
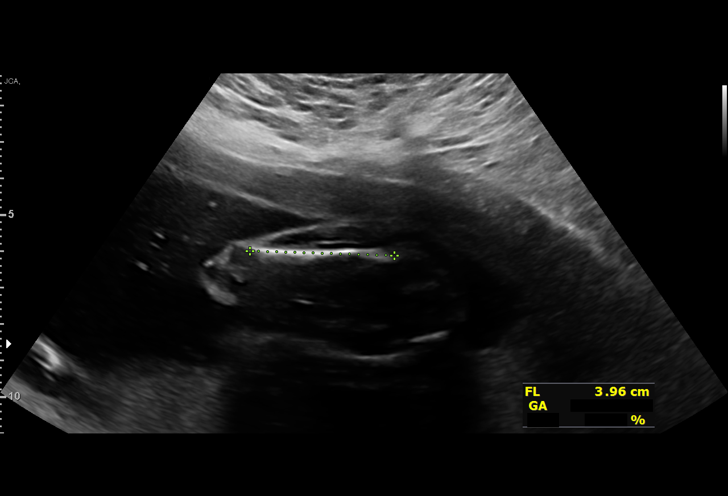
[im 42/71]
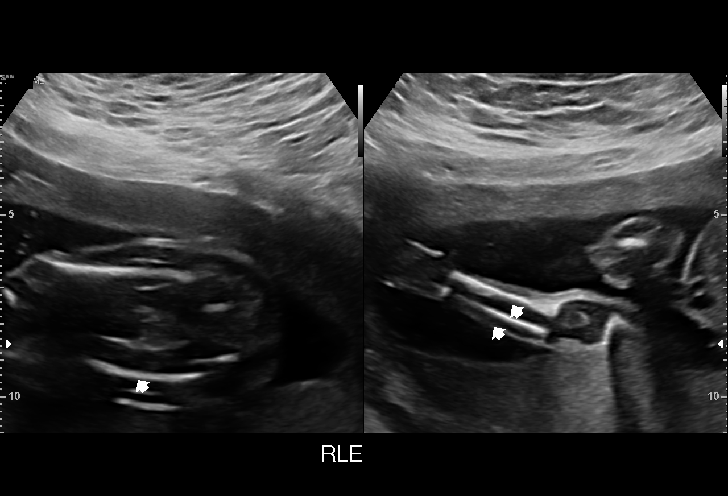
[im 47/71]
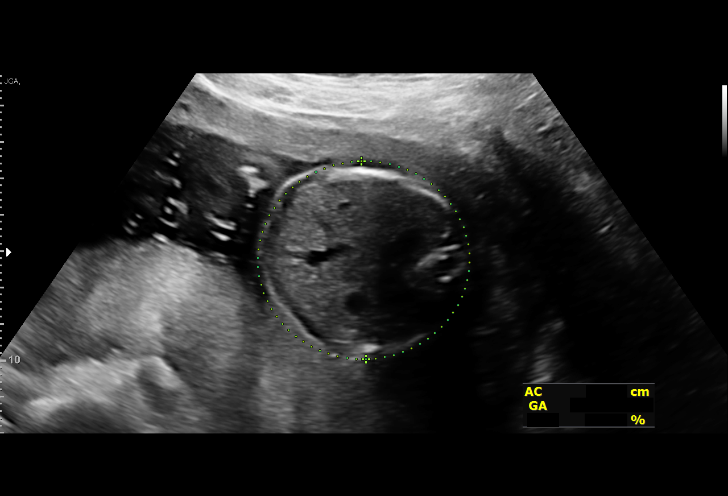
[im 52/71]
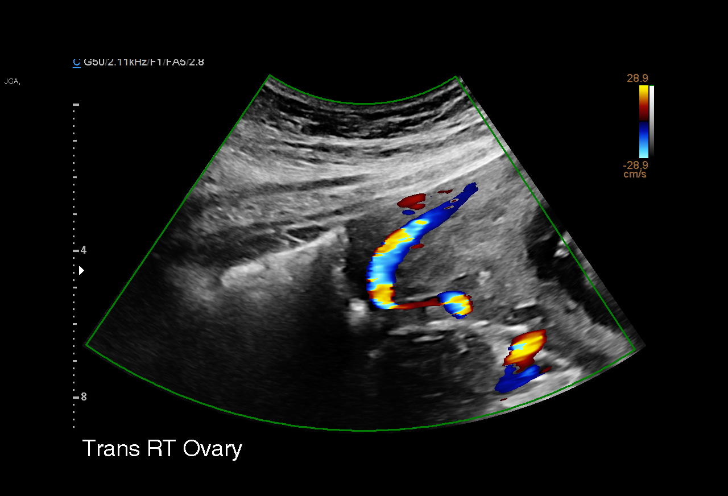
[im 58/71]
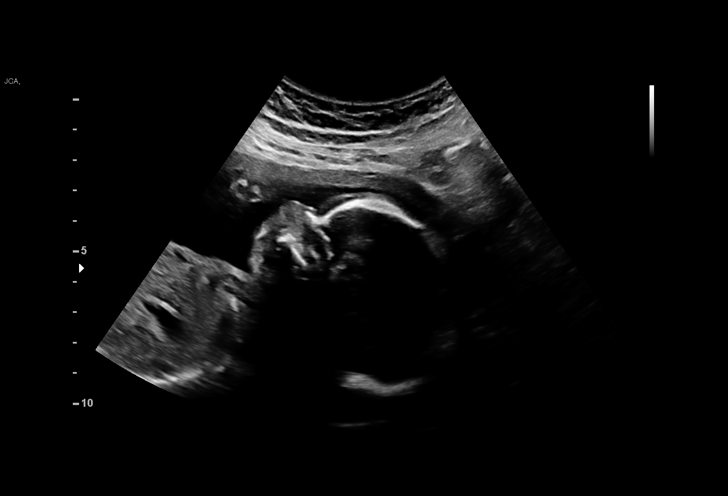
[im 63/71]
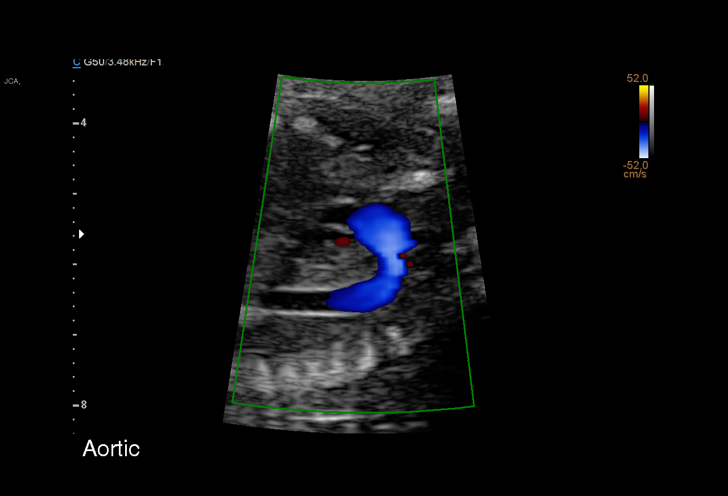
[im 68/71]
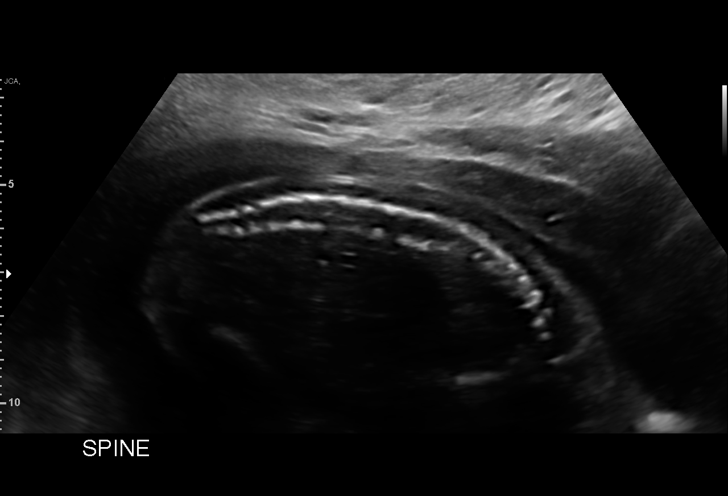

[13 of 28 positions shown; findings below may reference images not displayed]

Indications

 Obesity complicating pregnancy, second
 trimester (BMI 37)
 Late prenatal care, second trimester
 Asthma                                         CSS.WS [id number]
 Encounter for antenatal screening for
 malformations (LR NIPS; Neg AFP)
 22 weeks gestation of pregnancy
Fetal Evaluation

 Num Of Fetuses:         1
 Fetal Heart Rate(bpm):  164
 Cardiac Activity:       Observed
 Presentation:           Cephalic
 Placenta:               Posterior
 P. Cord Insertion:      Not well visualized

 Amniotic Fluid
 AFI FV:      Within normal limits

                             Largest Pocket(cm)

Biometry

 BPD:      50.9  mm     G. Age:  21w 3d         13  %    CI:        68.08   %    70 - 86
                                                         FL/HC:      18.2   %    18.4 -
 HC:      197.3  mm     G. Age:  21w 6d         18  %    HC/AC:      1.14        1.06 -
 AC:      173.8  mm     G. Age:  22w 2d         38  %    FL/BPD:     70.7   %    71 - 87
 FL:         36  mm     G. Age:  21w 3d         12  %    FL/AC:      20.7   %    20 - 24
 HUM:      34.2  mm     G. Age:  21w 5d         29  %
 CER:      25.4  mm     G. Age:  23w 1d         88  %
 NFT:       5.7  mm

 LV:        5.6  mm
 CM:        8.6  mm

 Est. FW:     459  gm           1 lb     20  %
OB History

 Gravidity:    1         Term:   0
 Living:       0
Gestational Age

 LMP:           22w 3d        Date:  11/07/19                 EDD:   08/13/20
 U/S Today:     21w 5d                                        EDD:   08/18/20
 Best:          22w 3d     Det. By:  LMP  (11/07/19)          EDD:   08/13/20
Anatomy

 Cranium:               Appears normal         Aortic Arch:            Not well visualized
 Cavum:                 Appears normal         Ductal Arch:            Not well visualized
 Ventricles:            Appears normal         Diaphragm:              Appears normal
 Choroid Plexus:        Appears normal         Stomach:                Appears normal, left
                                                                       sided
 Cerebellum:            Appears normal         Abdomen:                Appears normal
 Posterior Fossa:       Appears normal         Abdominal Wall:         Appears nml (cord
                                                                       insert, abd wall)
 Nuchal Fold:           Not applicable (>20    Cord Vessels:           Appears normal (3
                        wks GA)                                        vessel cord)
 Face:                  Orbits nl; profile not Kidneys:                Appear normal
                        well visualized
 Lips:                  Not well visualized    Bladder:                Appears normal
 Thoracic:              Appears normal         Spine:                  Limited views
                                                                       appear normal
 Heart:                 Not well visualized    Upper Extremities:      Appears normal
 RVOT:                  Not well visualized    Lower Extremities:      Appears normal
 LVOT:                  Not well visualized

 Other:  Technically difficult due to fetal position. Male fetus
Cervix Uterus Adnexa

 Cervix
 Length:           3.48  cm.
 Normal appearance by transabdominal scan. Closed

 Right Ovary
 Within normal limits.

 Left Ovary
 Within normal limits.
Impression

 G1 P0. Patient is here for fetal anatomy scan.
 On cell-free fetal DNA screening, the risks of fetal
 aneuploidies are not increased .MSAFP screening showed
 low risk for open-neural tube defects .

 We performed fetal anatomy scan. No makers of
 aneuploidies or fetal structural defects are seen. Fetal
 biometry is consistent with her previously-established dates.
 Amniotic fluid is normal and good fetal activity is seen.
 Patient understands the limitations of ultrasound in detecting
 fetal anomalies.
 Maternal obesity imposes limitations on the resolution of
 images, and failure to detect fetal anomalies is more common
 in obese pregnant women.

 "MyChart":Patient does not want to know fetal sex/gender.
 We informed her that fetal sex/gender will be mentioned in
 ultrasound report and will be in "MyChart" that will be
 accessible to the patient .
Recommendations

 -An appointment was made for her to return in 4 weeks for
 completion of fetal anatomy.
                 Adlaho, Deeqa Rayaan

## 2022-06-06 ENCOUNTER — Encounter: Payer: Self-pay | Admitting: Gastroenterology

## 2022-07-06 ENCOUNTER — Encounter: Payer: Self-pay | Admitting: Gastroenterology

## 2022-07-06 ENCOUNTER — Ambulatory Visit (INDEPENDENT_AMBULATORY_CARE_PROVIDER_SITE_OTHER): Payer: Medicaid Other | Admitting: Gastroenterology

## 2022-07-06 VITALS — BP 114/70 | HR 80 | Ht <= 58 in | Wt 194.2 lb

## 2022-07-06 DIAGNOSIS — R1013 Epigastric pain: Secondary | ICD-10-CM | POA: Diagnosis not present

## 2022-07-06 DIAGNOSIS — R197 Diarrhea, unspecified: Secondary | ICD-10-CM | POA: Diagnosis not present

## 2022-07-06 DIAGNOSIS — R110 Nausea: Secondary | ICD-10-CM | POA: Insufficient documentation

## 2022-07-06 MED ORDER — CHOLESTYRAMINE 4 G PO PACK: 4.0000 g | PACK | Freq: Every day | ORAL | 2 refills | Status: DC

## 2022-07-06 NOTE — Patient Instructions (Signed)
We have sent the following medications to your pharmacy for you to pick up at your convenience: Questran 4 gm once daily.   You have been scheduled for an Upper GI Series at [facility]. Your appointment is on Tuesday 07/24/22 at 9 am. Please arrive 30 minutes prior to your test for registration. Make sure not to eat or drink anything after midnight on the night before your test. If you need to reschedule, please call radiology at [phone]. ________________________________________________________________ An upper GI series uses x rays to help diagnose problems of the upper GI tract, which includes the esophagus, stomach, and duodenum. The duodenum is the first part of the small intestine. An upper GI series is conducted by a radiology technologist or a radiologist--a doctor who specializes in x-ray imaging--at a hospital or outpatient center. While sitting or standing in front of an x-ray machine, the patient drinks barium liquid, which is often white and has a chalky consistency and taste. The barium liquid coats the lining of the upper GI tract and makes signs of disease show up more clearly on x rays. X-ray video, called fluoroscopy, is used to view the barium liquid moving through the esophagus, stomach, and duodenum. Additional x rays and fluoroscopy are performed while the patient lies on an x-ray table. To fully coat the upper GI tract with barium liquid, the technologist or radiologist may press on the abdomen or ask the patient to change position. Patients hold still in various positions, allowing the technologist or radiologist to take x rays of the upper GI tract at different angles. If a technologist conducts the upper GI series, a radiologist will later examine the images to look for problems.  This test typically takes about 1 hour to complete. __________________________________________________________________

## 2022-07-06 NOTE — Progress Notes (Signed)
[id number] Karen Mcdaniel [id number] March 23, 1991   HISTORY OF PRESENT ILLNESS: This is a 32 year old female who is new to our office.  She has been referred here by Junie Spencer, PA, for evaluation of epigastric abdominal pain.  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  Used to follow with the ENT, but has not been seen in over 2 years.  Her previous ENT does not take her current insurance so she has a referral in to see somebody new, but has not yet made an appointment.  She is here today for evaluation of nausea and epigastric/upper abdominal pain.  She says that these issues have been going on for years.  She says that every day she has pain in her upper abdomen and nausea.  No vomiting.  She is on pantoprazole 40 mg twice daily.  She thinks she has been on that medication and dose since 2021.  She has Zofran that she uses as needed and it does help when she takes it.  She also reports chronic diarrhea.  Says that she has diarrhea as soon as she eats.  She denies seeing any blood.  He says that if she takes probiotics they seem to help with the diarrhea some.  She had her gallbladder out she was probably about 32 years old.  Previous celiac labs were negative.  H. pylori testing negative in September 2023 and in 2021 via breath test.  Lennox Pippins is listed on her medication list, but she cannot recall taking that.  It looks like that was probably back in 2019.  TSH normal in 01/2022.   Past Medical History:  Diagnosis Date   Anemia    Appendicitis    Dyspnea    with exertion   GERD (gastroesophageal reflux disease)    Headache    Hearing loss    bilateral   History of kidney stones    Narrowing of airway    Obesity    Past Surgical History:  Procedure Laterality Date   APPENDECTOMY     CHOLECYSTECTOMY     LUNG BIOPSY     MICROLARYNGOSCOPY WITH LASER AND BALLOON DILATION N/A 07/04/2016   Procedure:  MICROLARYNGOSCOPY WITH LASER AND BALLOON DILATION;  Surgeon: Izora Gala, MD;  Location: [facility];  Service: ENT;  Laterality: N/A;   TONSILLECTOMY AND ADENOIDECTOMY     TRACHEOSTOMY      reports that she has never smoked. She has never used smokeless tobacco. She reports current alcohol use. She reports that she does not use drugs. family history includes Anemia in her brother and mother; Asthma in her brother and mother; Hyperlipidemia in her maternal grandfather; Hypertension in her maternal grandmother. Allergies  Allergen Reactions   Celexa [Citalopram] Other (See Comments)    Felt more depressed      Outpatient Encounter Medications as of 07/06/2022  Medication Sig   ACCRUFER 30 MG CAPS Take 1 capsule by mouth 2 (two) times daily.   albuterol (PROVENTIL) (2.5 MG/3ML) 0.083% nebulizer solution Take 2.5 mg by nebulization as needed.   albuterol (VENTOLIN HFA) 108 (90 Base) MCG/ACT inhaler Inhale 1 puff into the lungs as needed.   Budesonide 90 MCG/ACT inhaler Inhale 1 puff into the lungs 2 (two) times daily as needed (shortness of breath).   Eluxadoline (VIBERZI) 100 MG TABS Take 100 mg by mouth 2 (two) times daily.   famotidine (PEPCID) 20 MG tablet Take 20  mg by mouth 2 (two) times daily.   ondansetron (ZOFRAN-ODT) 8 MG disintegrating tablet Take 8 mg by mouth 3 (three) times daily.   pantoprazole (PROTONIX) 40 MG tablet Take 40 mg by mouth 2 (two) times daily.   Prenatal Vit-Fe Fumarate-FA (PRENATAL COMPLETE) 14-0.4 MG TABS Take 1 tablet by mouth daily.   [DISCONTINUED] Albuterol Sulfate, sensor, 108 (90 Base) MCG/ACT AEPB Inhale 2 puffs into the lungs every 4 (four) hours.   No facility-administered encounter medications on file as of 07/06/2022.     REVIEW OF SYSTEMS  : All other systems reviewed and negative except where noted in the History of Present Illness.   PHYSICAL EXAM: BP 114/70 (BP Location: Left Arm, Patient Position: Sitting, Cuff Size: Large)   Pulse 80   Ht '4\' 10"'$   (1.473 m) Comment: height measured without shoes  Wt 194 lb 4 oz (88.1 kg)   LMP 07/02/2022   Breastfeeding No   BMI 40.60 kg/m  General: Well developed female in no acute distress Head: Normocephalic and atraumatic Eyes:  Sclerae anicteric, conjunctiva pink. Ears: Normal auditory acuity Lungs: Clear throughout to auscultation; no W/R/R. Heart: Regular rate and rhythm; no M/R/G. Abdomen: Soft, non-distended.  BS present.  Some upper abdominal TTP. Musculoskeletal: Symmetrical with no gross deformities  Skin: No lesions on visible extremities Extremities: No edema  Neurological: Alert oriented x 4, grossly non-focal Psychological:  Alert and cooperative. Normal mood and affect  ASSESSMENT AND PLAN: *Diarrhea:  Chronic for years.  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  Will start Questran 1 packet daily.  Prescription sent to pharmacy. *Nausea and epigastric/upper abdominal pain: Also chronic for years.  She is on pantoprazole 40 mg twice daily.  Has Zofran to use as needed.  Celiac testing and H. pylori testing previously negative.  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 she would need ENT clearance.  She has not seen ENT in over 2 years.  Has a referral to be seen as her previous ENT does not take her current insurance.  Unfortunately until she is seen we will not be able to obtain that clearance.  If and when we do decide to proceed with EGD she will need to be done at [facility].  For now we will do an upper GI series.  **Follow-up with me in 4 to 6 weeks.  CC:  [facility]* *Junie Spencer, PA-C

## 2022-07-09 NOTE — Progress Notes (Signed)
Agree with the assessment and plan as outlined by Alonza Bogus, PA-C.  Given the chronicity of her abdominal pain and nausea with negative H. Pylori and celiac testing, a functional etiology is most likely.  An upper endoscopy is certainly reasonable, but has a low probability of a diagnostic yield.  If an elective sedated procedure is felt to be significant increased risk because of her altered airway, then we continue to defer endoscopic examination. The patient is noted to have been anemic in September (hgb 9).  Although this could certainly be from menstrual blood loss, I think a repeat CBC and iron panel would be reasonable, as well as further investigation into the chronicity of her anemia and possible relationship with menses.  Ekam Besson E. Candis Schatz, MD  [facility]

## 2022-07-24 ENCOUNTER — Ambulatory Visit (HOSPITAL_COMMUNITY)
Admission: RE | Admit: 2022-07-24 | Discharge: 2022-07-24 | Disposition: A | Discharge status: Home / Self Care | Payer: Medicaid Other | Source: Ambulatory Visit | Attending: Gastroenterology | Admitting: Gastroenterology

## 2022-07-24 DIAGNOSIS — R110 Nausea: Secondary | ICD-10-CM | POA: Insufficient documentation

## 2022-07-24 DIAGNOSIS — R1013 Epigastric pain: Secondary | ICD-10-CM

## 2022-08-17 ENCOUNTER — Other Ambulatory Visit (INDEPENDENT_AMBULATORY_CARE_PROVIDER_SITE_OTHER): Payer: Medicaid Other

## 2022-08-17 ENCOUNTER — Ambulatory Visit (INDEPENDENT_AMBULATORY_CARE_PROVIDER_SITE_OTHER): Payer: Medicaid Other | Admitting: Gastroenterology

## 2022-08-17 ENCOUNTER — Encounter: Payer: Self-pay | Admitting: Gastroenterology

## 2022-08-17 VITALS — BP 118/78 | HR 80 | Ht <= 58 in | Wt 194.0 lb

## 2022-08-17 DIAGNOSIS — K219 Gastro-esophageal reflux disease without esophagitis: Secondary | ICD-10-CM

## 2022-08-17 DIAGNOSIS — R1013 Epigastric pain: Secondary | ICD-10-CM | POA: Diagnosis not present

## 2022-08-17 DIAGNOSIS — D509 Iron deficiency anemia, unspecified: Secondary | ICD-10-CM | POA: Diagnosis not present

## 2022-08-17 DIAGNOSIS — K9089 Other intestinal malabsorption: Secondary | ICD-10-CM | POA: Diagnosis not present

## 2022-08-17 LAB — CBC WITH DIFFERENTIAL/PLATELET
Basophils Absolute: 0.1 10*3/uL (ref 0.0–0.1)
Basophils Relative: 0.7 % (ref 0.0–3.0)
Eosinophils Absolute: 0.3 10*3/uL (ref 0.0–0.7)
Eosinophils Relative: 2.8 % (ref 0.0–5.0)
HCT: 28.9 % — ABNORMAL LOW (ref 36.0–46.0)
Hemoglobin: 9.1 g/dL — ABNORMAL LOW (ref 12.0–15.0)
Lymphocytes Relative: 20.4 % (ref 12.0–46.0)
Lymphs Abs: 1.9 10*3/uL (ref 0.7–4.0)
MCHC: 31.6 g/dL (ref 30.0–36.0)
MCV: 54.6 fl — ABNORMAL LOW (ref 78.0–100.0)
Monocytes Absolute: 0.6 10*3/uL (ref 0.1–1.0)
Monocytes Relative: 6.2 % (ref 3.0–12.0)
Neutro Abs: 6.7 10*3/uL (ref 1.4–7.7)
Neutrophils Relative %: 69.9 % (ref 43.0–77.0)
Platelets: 302 10*3/uL (ref 150.0–400.0)
RBC: 5.3 Mil/uL — ABNORMAL HIGH (ref 3.87–5.11)
RDW: 19.5 % — ABNORMAL HIGH (ref 11.5–15.5)
WBC: 9.6 10*3/uL (ref 4.0–10.5)

## 2022-08-17 LAB — IBC + FERRITIN
Ferritin: 7.9 ng/mL — ABNORMAL LOW (ref 10.0–291.0)
Iron: 22 ug/dL — ABNORMAL LOW (ref 42–145)
Saturation Ratios: 5.2 % — ABNORMAL LOW (ref 20.0–50.0)
TIBC: 421.4 ug/dL (ref 250.0–450.0)
Transferrin: 301 mg/dL (ref 212.0–360.0)

## 2022-08-17 LAB — FOLATE: Folate: 9.8 ng/mL (ref >5.9)

## 2022-08-17 LAB — VITAMIN B12: Vitamin B-12: 318 pg/mL (ref 211–911)

## 2022-08-17 MED ORDER — DEXLANSOPRAZOLE 30 MG PO CPDR: 30.0000 mg | DELAYED_RELEASE_CAPSULE | Freq: Two times a day (BID) | ORAL | 3 refills | Status: AC

## 2022-08-17 NOTE — Progress Notes (Signed)
08/17/2022 Devani Brilliant [id number] [birth year]   HISTORY OF PRESENT ILLNESS: This is a 32 year old female who was seen by me on 07/06/2022 as a new patient.  We were seeing her for chronic issues with diarrhea as well as chronic issues of nausea and epigastric/upper abdominal pain.  Diarrhea suspected to be due to bile salt related from not having her gallbladder.  She was started on Questran 1 packet daily and says that that has helped significantly with that issue.  In regards to the nausea and epigastric/upper abdominal pain, she is on pantoprazole 40 mg twice daily.  Previous celiac testing and H. pylori testing were negative.  Upper GI study showed a tiny hiatal hernia and active reflux despite her twice daily PPI.  We decided to go that route instead of EGD since she has complicated upper airway history requiring previous trach, has a narrow airway and usually follows with ENT needing occasional dilation, etc.  She has not been seen by ENT in some years so we would not be able to obtain clearance for those procedures.  She still has not established care with them, but intends to do so.  She says that the nausea is okay, but the upper abdominal discomfort is still there.  Was also noted that she is anemic with last hemoglobin in September 2023 in the 10 g range.  Iron studies previously low as well.  She says that historically she has had heavy periods.  She has had IV iron infusions in the past.  Says she is supposed to be taking oral iron supplementation, but she often forgets to take it and has a difficult time swallowing pills with her upper airway issue.  She denies any black or bloody stools.   Past Medical History:  Diagnosis Date   Anemia    Appendicitis    Dyspnea    with exertion   GERD (gastroesophageal reflux disease)    Headache    Hearing loss    bilateral   History of kidney stones    Narrowing of airway    Obesity    Past Surgical History:  Procedure Laterality  Date   APPENDECTOMY     CHOLECYSTECTOMY     LUNG BIOPSY     MICROLARYNGOSCOPY WITH LASER AND BALLOON DILATION N/A 07/04/2016   Procedure: MICROLARYNGOSCOPY WITH LASER AND BALLOON DILATION;  Surgeon: Serena Colonel, MD;  Location: [facility];  Service: ENT;  Laterality: N/A;   TONSILLECTOMY AND ADENOIDECTOMY     TRACHEOSTOMY      reports that she has never smoked. She has never used smokeless tobacco. She reports current alcohol use. She reports that she does not use drugs. family history includes Anemia in her brother and mother; Asthma in her brother and mother; Hyperlipidemia in her maternal grandfather; Hypertension in her maternal grandmother. Allergies  Allergen Reactions   Celexa [Citalopram] Other (See Comments)    Felt more depressed      Outpatient Encounter Medications as of 08/17/2022  Medication Sig   ACCRUFER 30 MG CAPS Take 1 capsule by mouth 2 (two) times daily.   albuterol (PROVENTIL) (2.5 MG/3ML) 0.083% nebulizer solution Take 2.5 mg by nebulization as needed.   albuterol (VENTOLIN HFA) 108 (90 Base) MCG/ACT inhaler Inhale 1 puff into the lungs as needed.   Budesonide 90 MCG/ACT inhaler Inhale 1 puff into the lungs 2 (two) times daily as needed (shortness of breath).   cholestyramine (QUESTRAN) 4 g packet Take 1 packet (4 g total) by  mouth daily.   Eluxadoline (VIBERZI) 100 MG TABS Take 100 mg by mouth 2 (two) times daily.   famotidine (PEPCID) 20 MG tablet Take 20 mg by mouth 2 (two) times daily.   ondansetron (ZOFRAN-ODT) 8 MG disintegrating tablet Take 8 mg by mouth 3 (three) times daily.   pantoprazole (PROTONIX) 40 MG tablet Take 40 mg by mouth 2 (two) times daily.   Prenatal Vit-Fe Fumarate-FA (PRENATAL COMPLETE) 14-0.4 MG TABS Take 1 tablet by mouth daily.   No facility-administered encounter medications on file as of 08/17/2022.     REVIEW OF SYSTEMS  : All other systems reviewed and negative except where noted in the History of Present Illness.   PHYSICAL  EXAM: BP 118/78   Pulse 80   Ht  (1.473 m)   Wt 194 lb (88 kg)   BMI 40.55 kg/m  General: Well developed female in no acute distress Head: Normocephalic and atraumatic Eyes:  Sclerae anicteric, conjunctiva pink. Ears: Normal auditory acuity Lungs: Clear throughout to auscultation; no W/R/R. Heart: Regular rate and rhythm.  No M/R/G. Abdomen: Soft, non-distended.  BS present.  Mild epigastric TTP. Musculoskeletal: Symmetrical with no gross deformities  Skin: No lesions on visible extremities Extremities: No edema  Neurological: Alert oriented x 4, grossly non-focal Psychological:  Alert and cooperative. Normal mood and affect  ASSESSMENT AND PLAN: *Diarrhea: Chronic for years, suspected bile salt related due to not having a gallbladder.  Much improved on Questran 1 packet daily.  Will continue. *Nausea and epigastric abdominal pain: This is also chronic for years.  She is on pantoprazole 40 mg twice daily.  Celiac and H. pylori testing previously negative.  Upper GI study shows a tiny hiatal hernia and active reflux despite the pantoprazole 40 mg twice daily.  Will discontinue pantoprazole try something different.  Will see if we can get Dexilant 30 mg twice daily for now.  Prescription sent to pharmacy. *Iron deficiency anemia: This has been an issue for years as well.  No evidence of overt GI bleeding.  She historically had heavy periods.  Has history of requiring IV iron infusions.  Supposed be on oral iron, but has trouble swallowing pills and forgets to take it as well.  Will check a CBC, iron studies, vitamin B12 and folate levels today.  If iron studies still extremely low then we will for referral to hematology for IV iron infusions.  **She still has not seen ENT.  If any need for procedures in the future they will need to be done at the hospital due to difficult airway and would like clearance from ENT due to her complicated issue with narrow upper airway, previous trach,  etc.   CC:  Tracey Harries, MD

## 2022-08-17 NOTE — Patient Instructions (Addendum)
We have sent the following medications to your pharmacy for you to pick up at your convenience: Dexilant 30 mg twice daily  DISCONTINUE pantoprazole.  Follow up with Doug Sou, PA on 10/16/22 at 10:00 am  Your provider has requested that you go to the basement level for lab work before leaving today. Press "B" on the elevator. The lab is located at the first door on the left as you exit the elevator.  _______________________________________________________  If your blood pressure at your visit was 140/90 or greater, please contact your primary care physician to follow up on this.  _______________________________________________________  If you are age 53 or older, your body mass index should be between 23-30. Your Body mass index is 40.55 kg/m. If this is out of the aforementioned range listed, please consider follow up with your Primary Care Provider.  If you are age 64 or younger, your body mass index should be between 19-25. Your Body mass index is 40.55 kg/m. If this is out of the aformentioned range listed, please consider follow up with your Primary Care Provider.   ________________________________________________________  The [facility] providers would like to encourage you to use [facility] to communicate with providers for non-urgent requests or questions.  Due to long hold times on the telephone, sending your provider a message by [facility] may be a faster and more efficient way to get a response.  Please allow 48 business hours for a response.  Please remember that this is for non-urgent requests.  _______________________________________________________  Due to recent changes in healthcare laws, you may see the results of your imaging and laboratory studies on MyChart before your provider has had a chance to review them.  We understand that in some cases there may be results that are confusing or concerning to you. Not all laboratory results come back in the same time frame and the  provider may be waiting for multiple results in order to interpret others.  Please give [place] 48 hours in order for your provider to thoroughly review all the results before contacting the office for clarification of your results.

## 2022-08-20 ENCOUNTER — Other Ambulatory Visit: Payer: Self-pay

## 2022-08-20 DIAGNOSIS — D509 Iron deficiency anemia, unspecified: Secondary | ICD-10-CM

## 2022-08-20 DIAGNOSIS — D649 Anemia, unspecified: Secondary | ICD-10-CM

## 2022-08-21 NOTE — Progress Notes (Signed)
Agree with the assessment and plan as outlined by Jessica Zehr, PA-C.  Eshal Propps E. Jennene Downie, MD  [facility]  

## 2022-09-13 NOTE — Progress Notes (Signed)
[facility] Cancer Initial Visit:  Patient Care Team: Tracey Harries, MD as PCP - General (Family Medicine)  CHIEF COMPLAINTS/PURPOSE OF CONSULTATION:  HISTORY OF PRESENTING ILLNESS: Karen Mcdaniel 32 y.o. female is here because of anemia Medical history is notable for appendicitis, GERD, bilateral hearing loss, nephrolithiasis, obesity, chronic nausea and abdominal pain, tracheal narrowing requiring periodic dilation  July 24, 2022: Upper GI showed mild spontaneous GERD  August 17, 2022: WBC 9.6 hemoglobin 9.1 MCV 55 platelet count 302; 67 segs 20 lymphs 6 monos 3 eos 1 basophil. Ferritin 8 B12 318 folate 9.8  Sep 14 2022:  [facility] Hematology Consult Patient states that she has been anemic almost all of her life.    Patient is G1 P1.  Menopause not reached.  Menses occur monthly and last 7 days and are regular.  Bleeding varies between normal to heavy.  Does not have bleeding between periods.    Does pass clots Last pregnancy was delivered in April 2022 by NSVD.  Patient does not have history of uterine fibroids/uterine abnormalities.  Has taken oral iron/ received IV iron but has not required PRBC's in the past.   Has not tolerated oral iron owing to GI intolerance.  No reaction to IV iron.  Has a normal diet  No history of postpartum hemorrhage requiring transfusion.  No history of hemorrhage postoperatively requiring transfusion.     No hematochezia, melena, hemoptysis, hematuria.  No history of intra-articular or soft tissue bleeding.  No history of abnormal bleeding in family members.  Patient has symptoms of fatigue, pallor,  DOE, decreased performance status, cold intolerance.  Patient has pica ice but not to starch/dirt.    Social:  Single.  Works as a Copywriter, advertising.  Tobacco none.  EtOH only on special occasions.    Review of Systems - Oncology  MEDICAL HISTORY: Past Medical History:  Diagnosis Date   Anemia    Appendicitis    Dyspnea    with exertion   GERD  (gastroesophageal reflux disease)    Headache    Hearing loss    bilateral   History of kidney stones    Narrowing of airway    Obesity     SURGICAL HISTORY: Past Surgical History:  Procedure Laterality Date   APPENDECTOMY     CHOLECYSTECTOMY     LUNG BIOPSY     MICROLARYNGOSCOPY WITH LASER AND BALLOON DILATION N/A 07/04/2016   Procedure: MICROLARYNGOSCOPY WITH LASER AND BALLOON DILATION;  Surgeon: Serena Colonel, MD;  Location: [facility] OR;  Service: ENT;  Laterality: N/A;   TONSILLECTOMY AND ADENOIDECTOMY     TRACHEOSTOMY      SOCIAL HISTORY: Social History   Socioeconomic History   Marital status: Single    Spouse name: Not on file   Number of children: 1   Years of education: Not on file   Highest education level: Not on file  Occupational History   Occupation: self employed  Tobacco Use   Smoking status: Never   Smokeless tobacco: Never  Vaping Use   Vaping Use: Never used  Substance and Sexual Activity   Alcohol use: Yes    Comment: occasional   Drug use: No   Sexual activity: Yes  Other Topics Concern   Not on file  Social History Narrative   Not on file   Social Determinants of Health   Financial Resource Strain: Not on file  Food Insecurity: No Food Insecurity (09/14/2022)   Hunger Vital Sign    Worried  About Running Out of Food in the Last Year: Never true    Ran Out of Food in the Last Year: Never true  Transportation Needs: No Transportation Needs (09/14/2022)   PRAPARE - Administrator, Civil Service (Medical): No    Lack of Transportation (Non-Medical): No  Physical Activity: Not on file  Stress: Not on file  Social Connections: Not on file  Intimate Partner Violence: Not At Risk (09/14/2022)   Humiliation, Afraid, Rape, and Kick questionnaire    Fear of Current or Ex-Partner: No    Emotionally Abused: No    Physically Abused: No    Sexually Abused: No    FAMILY HISTORY Family History  Problem Relation Age of Onset   Asthma Mother     Anemia Mother    Asthma Brother    Anemia Brother    Hypertension Maternal Grandmother    Hyperlipidemia Maternal Grandfather     ALLERGIES:  is allergic to celexa [citalopram].  MEDICATIONS:  Current Outpatient Medications  Medication Sig Dispense Refill   ACCRUFER 30 MG CAPS Take 1 capsule by mouth 2 (two) times daily.     albuterol (PROVENTIL) (2.5 MG/3ML) 0.083% nebulizer solution Take 2.5 mg by nebulization as needed.     albuterol (VENTOLIN HFA) 108 (90 Base) MCG/ACT inhaler Inhale 1 puff into the lungs as needed.     Budesonide 90 MCG/ACT inhaler Inhale 1 puff into the lungs 2 (two) times daily as needed (shortness of breath).     cholestyramine (QUESTRAN) 4 g packet Take 1 packet (4 g total) by mouth daily. 30 each 2   Dexlansoprazole (DEXILANT) 30 MG capsule DR Take 1 capsule (30 mg total) by mouth in the morning and at bedtime. 60 capsule 3   Eluxadoline (VIBERZI) 100 MG TABS Take 100 mg by mouth 2 (two) times daily.     famotidine (PEPCID) 20 MG tablet Take 20 mg by mouth 2 (two) times daily.     ondansetron (ZOFRAN-ODT) 8 MG disintegrating tablet Take 8 mg by mouth 3 (three) times daily.     Prenatal Vit-Fe Fumarate-FA (PRENATAL COMPLETE) 14-0.4 MG TABS Take 1 tablet by mouth daily.     No current facility-administered medications for this visit.    PHYSICAL EXAMINATION:  ECOG PERFORMANCE STATUS: 1 - Symptomatic but completely ambulatory   Vitals:   09/14/22 0934  BP: 115/73  Pulse: 86  Resp: 13  Temp: 98.2 F (36.8 C)  SpO2: 99%    Filed Weights   09/14/22 0934  Weight: 197 lb 6.4 oz (89.5 kg)     Physical Exam Vitals and nursing note reviewed.  Constitutional:      General: She is not in acute distress.    Appearance: Normal appearance. She is obese. She is not ill-appearing, toxic-appearing or diaphoretic.     Comments: Here alone.    HENT:     Head: Normocephalic and atraumatic.     Right Ear: External ear normal.     Left Ear: External ear  normal.     Nose: Nose normal. No congestion or rhinorrhea.  Eyes:     General: No scleral icterus.    Extraocular Movements: Extraocular movements intact.     Conjunctiva/sclera: Conjunctivae normal.     Pupils: Pupils are equal, round, and reactive to light.  Neck:     Comments: Tracheostomy scar Cardiovascular:     Rate and Rhythm: Normal rate.     Heart sounds: No murmur heard.    No  friction rub. No gallop.  Pulmonary:     Effort: Pulmonary effort is normal. No respiratory distress.     Breath sounds: Normal breath sounds. No stridor. No wheezing or rhonchi.  Abdominal:     General: Bowel sounds are normal.     Palpations: Abdomen is soft.     Tenderness: There is no abdominal tenderness. There is no guarding or rebound.  Musculoskeletal:        General: No swelling, tenderness or deformity.     Cervical back: Normal range of motion and neck supple. No rigidity or tenderness.     Right lower leg: No edema.     Left lower leg: No edema.  Lymphadenopathy:     Head:     Right side of head: No submental, submandibular, tonsillar, preauricular, posterior auricular or occipital adenopathy.     Left side of head: No submental, submandibular, tonsillar, preauricular, posterior auricular or occipital adenopathy.     Cervical: No cervical adenopathy.     Right cervical: No superficial, deep or posterior cervical adenopathy.    Left cervical: No superficial, deep or posterior cervical adenopathy.     Upper Body:     Right upper body: No supraclavicular, axillary, pectoral or epitrochlear adenopathy.     Left upper body: No supraclavicular, axillary, pectoral or epitrochlear adenopathy.  Skin:    General: Skin is warm.     Coloration: Skin is not jaundiced.  Neurological:     General: No focal deficit present.     Mental Status: She is alert and oriented to person, place, and time.     Cranial Nerves: Cranial nerve deficit present.     Comments: Has bilateral hearing aids   Psychiatric:        Mood and Affect: Mood normal.        Behavior: Behavior normal.        Thought Content: Thought content normal.        Judgment: Judgment normal.      LABORATORY DATA: I have personally reviewed the data as listed:  Clinical Support on 09/14/2022  Component Date Value Ref Range Status   Coagulation Factor VIII 09/14/2022 104  56 - 140 % Final   Ristocetin Co-factor, Plasma 09/14/2022 79  50 - 200 % Final   Comment: (NOTE) Performed At: [facility] [address] [place], [place] [id number] Jolene Schimke MD WU:[id number]    Von Willebrand Antigen, Plasma 09/14/2022 140  50 - 200 % Final   Comment: (NOTE) This test was developed and its performance characteristics determined by [facility]. It has not been cleared or approved by the Food and Drug Administration.    Fibrinogen 09/14/2022 534 (H)  210 - 475 mg/dL Final   Comment: (NOTE) Fibrinogen results may be underestimated in patients receiving thrombolytic therapy. Performed at [facility], [address] [place], [place] [address]    aPTT 09/14/2022 30  24 - 36 seconds Final   Performed at [facility], [address] [place], [place] [address]   Prothrombin Time 09/14/2022 13.9  11.4 - 15.2 seconds Final   INR 09/14/2022 1.1  0.8 - 1.2 Final   Comment: (NOTE) INR goal varies based on device and disease states. Performed at [facility], [address], [place], [place] [address]    Hgb F 09/14/2022 0.0  0.0 - 2.0 % Final   Hgb A 09/14/2022 97.9  96.4 - 98.8 % Final   Hgb A2 09/14/2022 2.1  1.8 - 3.2 % Final   Hgb S 09/14/2022 0.0  0.0 % Final   Interpretation, Hgb Fract 09/14/2022 Comment   Final   Comment: (NOTE) Normal hemoglobin present; no hemoglobin variant or beta thalassemia identified. Note: Alpha thalassemia may not be detected by the Hgb Fractionation Cascade panel. If alpha thalassemia is suspected, [facility] offers  Alpha-Thalassemia DNA Analysis [id number]). Performed At: [facility] [address] [place], [place] [id number] Jolene Schimke MD BJ:[id number]    Zinc 09/14/2022 77  44 - 115 ug/dL Final   Comment: (NOTE) This test was developed and its performance characteristics determined by [facility]. It has not been cleared or approved by the Food and Drug Administration.                                Detection Limit = 5 Performed At: [facility] [address] [place], [place] [id number] Jolene Schimke MD [id number]    Haptoglobin 09/14/2022 236  33 - 278 mg/dL Final   Comment: (NOTE) Performed At: [facility] [address] [place], [place] [id number] Jolene Schimke MD UY:[id number]    Copper 09/14/2022 133  80 - 158 ug/dL Final   Comment: (NOTE) This test was developed and its performance characteristics determined by [facility]. It has not been cleared or approved by the Food and Drug Administration.                                Detection Limit = 5 Performed At: [facility] [address] [place], [place] [id number] Jolene Schimke MD [id number]    Interpretation 09/14/2022 Note   Final   Comment: (NOTE) ------------------------------- COAGULATION: VON WILLEBRAND FACTOR ASSESSMENT CURRENT RESULTS ASSESSMENT The VWF:Ag is normal. The VWF:RCo is normal. The FVIII is normal. VON WILLEBRAND FACTOR ASSESSMENT CURRENT RESULTS INTERPRETATION - These results are not consistent with a diagnosis of VWD according to the current NHLBI guideline. VON WILLEBRAND FACTOR ASSESSMENT - Results may be falsely elevated and possibly falsely normal as VWF and FVIII may increase in pregnancy, in samples drawn from patients (particularly children) who are visibly stressed at the time of phlebotomy, as acute phase reactants, or in response to certain drug therapies such as desmopressin. Repeat testing may be necessary before excluding a diagnosis of VWD  especially if the clinical suspicion is high for an underlying bleeding disorder. The setting for phlebotomy should be as calm as possible and patients should be encouraged to sit quietly prior to the blood draw. VON WILLEBRAND FA                          CTOR ASSESSMENT DEFINITIONS - VWD - von Willebrand disease; VWF - von Willebrand factor; VWF:Ag - VWF antigen; VWF:RCo - VWF ristocetin cofactor activity; FVIII - factor VIII activity. MEDICAL DIRECTOR: For questions regarding panel interpretation, please contact Lebron Conners, M.D. at [facility] at [phone]. ------------------------------- DISCLAIMER These assessments and interpretations are provided as a convenience in support of the physician-patient relationship and are not intended to replace the physician's clinical judgment. They are derived from national guidelines in addition to other evidence and expert opinion. The clinician should consider this information within the context of clinical opinion and the individual patient. SEE GUIDANCE FOR VON WILLEBRAND FACTOR ASSESSMENT: (1) The National Heart, Lung and Blood Institute. The Diagnosis, Evaluation and Management of von  Willebrand Disease. Lafe Garin, MD: [employer]                          h Publication [phone]. 2007. Available at http://kemp.com/. (2) Annie Sable et al. Othella Boyer J Hematol. 2009; 84(6):366-370. (3) Laffan M et al. Haemophilia. 2004;10(3):199-217. (4) Pasi KJ et al. Haemophilia. 2004; 10(3):218-231. Performed At: [facility] [address] [place], [place] [id number] Blanchie Serve MD OZ:[id number]   Appointment on 08/17/2022  Component Date Value Ref Range Status   Vitamin B-12 08/17/2022 318  211 - 911 pg/mL Final   Folate 08/17/2022 9.8  >5.9 ng/mL Final   Iron 08/17/2022 22 (L)  42 - 145 ug/dL Final   Transferrin 62/95/2841 301.0  212.0 - 360.0 mg/dL Final   Saturation  Ratios 08/17/2022 5.2 (L)  20.0 - 50.0 % Final   Ferritin 08/17/2022 7.9 (L)  10.0 - 291.0 ng/mL Final   TIBC 08/17/2022 421.4  250.0 - 450.0 mcg/dL Final   WBC 32/44/0102 9.6  4.0 - 10.5 K/uL Final   RBC 08/17/2022 5.30 (H)  3.87 - 5.11 Mil/uL Final   Hemoglobin 08/17/2022 9.1 (L)  12.0 - 15.0 g/dL Final   HCT 72/53/6644 28.9 (L)  36.0 - 46.0 % Final   MCV 08/17/2022 54.6 Repeated and verified X2. (L)  78.0 - 100.0 fl Final   MCHC 08/17/2022 31.6  30.0 - 36.0 g/dL Final   RDW 03/47/4259 19.5 (H)  11.5 - 15.5 % Final   Platelets 08/17/2022 302.0 Repeated and verified X2.  150.0 - 400.0 K/uL Final   Neutrophils Relative % 08/17/2022 69.9  43.0 - 77.0 % Final   Lymphocytes Relative 08/17/2022 20.4  12.0 - 46.0 % Final   Monocytes Relative 08/17/2022 6.2  3.0 - 12.0 % Final   Eosinophils Relative 08/17/2022 2.8  0.0 - 5.0 % Final   Basophils Relative 08/17/2022 0.7  0.0 - 3.0 % Final   Neutro Abs 08/17/2022 6.7  1.4 - 7.7 K/uL Final   Lymphs Abs 08/17/2022 1.9  0.7 - 4.0 K/uL Final   Monocytes Absolute 08/17/2022 0.6  0.1 - 1.0 K/uL Final   Eosinophils Absolute 08/17/2022 0.3  0.0 - 0.7 K/uL Final   Basophils Absolute 08/17/2022 0.1  0.0 - 0.1 K/uL Final    RADIOGRAPHIC STUDIES: I have personally reviewed the radiological images as listed and agree with the findings in the report  No results found.  ASSESSMENT/PLAN  Patient is a 32 year old female with symptomatic microcytic anemia presumed to be secondary to dysfunctional uterine bleeding.  Medical history is notable for appendicitis, GERD, bilateral hearing loss, nephrolithiasis, obesity, chronic nausea and abdominal pain, tracheal narrowing requiring periodic dilation    Anemia:  Most likely etiology is iron deficiency anemia owing to dysfunctional uterine bleeding/ exacerbated by high demand owing to prior pregnancy  Will obtain CBC with diff, CMP, Ferritin, B12, folate, retic count,  DAT, Haptoglobin Dysfunctional uterine  bleeding:  This is characterized by menses that are prolonged, irregular as well as by heavy bleeding with passage of clots.  Will evaluate for possible bleeding disorder with PT, PTT, Fibrinogen, von Willebrand screen.  Refer to gynecology for evaluation and management  Therapeutics:  Since patient has symptomatic anemia with Hgb < 10  and has not tolerated/been compliant with oral iron will arrange for IV iron replacement.   Given the severe symptoms we prefer to replete iron stores in one or two visits rather than  over the course of several months.  In addition ongoing blood loss exceeds the capacity of oral iron to meet needs. A discussion regarding risks was had with the patient.  IV iron has the potential to cause allergic reactions, including potentially life-threatening anaphylaxis.   IV iron may be associated with non-allergic infusion reactions including self-limiting urticaria, palpitations, dizziness, and neck and back spasm; generally, these occur in <1 percent of individuals and do not progress to more serious reactions. The non-allergic reaction consisting of flushing of the face and myalgias of the chest and back.   After discussion of the risks and benefits of IV iron therapy patient has elected to proceed with parenteral iron therapy.       Cancer Staging  No matching staging information was found for the patient.   No problem-specific Assessment & Plan notes found for this encounter.    Orders Placed This Encounter  Procedures   Copper, serum    Standing Status:   Future    Number of Occurrences:   1    Standing Expiration Date:   09/14/2023   Haptoglobin    Standing Status:   Future    Number of Occurrences:   1    Standing Expiration Date:   09/14/2023   Zinc    Standing Status:   Future    Number of Occurrences:   1    Standing Expiration Date:   09/14/2023   Hgb Fractionation Cascade    Standing Status:   Future    Number of Occurrences:   1    Standing Expiration  Date:   09/14/2023   Protime-INR    Standing Status:   Future    Number of Occurrences:   1    Standing Expiration Date:   09/14/2023   APTT    Standing Status:   Future    Number of Occurrences:   1    Standing Expiration Date:   09/14/2023   Fibrinogen    Standing Status:   Future    Number of Occurrences:   1    Standing Expiration Date:   09/14/2023   Von Willebrand panel    Standing Status:   Future    Number of Occurrences:   1    Standing Expiration Date:   09/14/2023    45  minutes was spent in patient care.  This included time spent preparing to see the patient (e.g., review of tests), obtaining and/or reviewing separately obtained history, counseling and educating the patient/family/caregiver, ordering medications, tests, or procedures; documenting clinical information in the electronic or other health record, independently interpreting results and communicating results to the patient/family/caregiver as well as coordination of care.       All questions were answered. The patient knows to call the clinic with any problems, questions or concerns.  This note was electronically signed.    Loni Muse, MD  10/07/2022 3:07 PM

## 2022-09-14 ENCOUNTER — Inpatient Hospital Stay: Payer: Medicaid Other | Attending: Oncology | Admitting: Oncology

## 2022-09-14 ENCOUNTER — Inpatient Hospital Stay: Payer: Medicaid Other

## 2022-09-14 ENCOUNTER — Other Ambulatory Visit: Payer: Self-pay

## 2022-09-14 VITALS — BP 115/73 | HR 86 | Temp 98.2°F | Resp 13 | Wt 197.4 lb

## 2022-09-14 DIAGNOSIS — N938 Other specified abnormal uterine and vaginal bleeding: Secondary | ICD-10-CM | POA: Insufficient documentation

## 2022-09-14 DIAGNOSIS — D509 Iron deficiency anemia, unspecified: Secondary | ICD-10-CM | POA: Diagnosis present

## 2022-09-14 DIAGNOSIS — D500 Iron deficiency anemia secondary to blood loss (chronic): Secondary | ICD-10-CM | POA: Diagnosis not present

## 2022-09-14 DIAGNOSIS — Z79899 Other long term (current) drug therapy: Secondary | ICD-10-CM | POA: Insufficient documentation

## 2022-09-14 DIAGNOSIS — E669 Obesity, unspecified: Secondary | ICD-10-CM | POA: Insufficient documentation

## 2022-09-14 LAB — FIBRINOGEN: Fibrinogen: 534 mg/dL — ABNORMAL HIGH (ref 210–475)

## 2022-09-14 LAB — PROTIME-INR
INR: 1.1 (ref 0.8–1.2)
Prothrombin Time: 13.9 seconds (ref 11.4–15.2)

## 2022-09-14 LAB — APTT: aPTT: 30 seconds (ref 24–36)

## 2022-09-15 LAB — HAPTOGLOBIN: Haptoglobin: 236 mg/dL (ref 33–278)

## 2022-09-16 LAB — VON WILLEBRAND PANEL
Coagulation Factor VIII: 104 % (ref 56–140)
Ristocetin Co-factor, Plasma: 79 % (ref 50–200)
Von Willebrand Antigen, Plasma: 140 % (ref 50–200)

## 2022-09-16 LAB — COAG STUDIES INTERP REPORT

## 2022-09-17 ENCOUNTER — Telehealth: Payer: Self-pay | Admitting: Oncology

## 2022-09-17 LAB — COPPER, SERUM: Copper: 133 ug/dL (ref 80–158)

## 2022-09-17 LAB — ZINC: Zinc: 77 ug/dL (ref 44–115)

## 2022-09-19 ENCOUNTER — Telehealth: Payer: Self-pay | Admitting: Oncology

## 2022-09-19 LAB — HGB FRACTIONATION CASCADE
Hgb A2: 2.1 % (ref 1.8–3.2)
Hgb A: 97.9 % (ref 96.4–98.8)
Hgb F: 0 % (ref 0.0–2.0)
Hgb S: 0 %

## 2022-09-21 ENCOUNTER — Other Ambulatory Visit: Payer: Self-pay

## 2022-09-21 ENCOUNTER — Inpatient Hospital Stay: Payer: Medicaid Other

## 2022-09-21 VITALS — BP 106/65 | HR 85 | Temp 98.6°F | Resp 16

## 2022-09-21 DIAGNOSIS — D500 Iron deficiency anemia secondary to blood loss (chronic): Secondary | ICD-10-CM

## 2022-09-21 DIAGNOSIS — D509 Iron deficiency anemia, unspecified: Secondary | ICD-10-CM | POA: Diagnosis not present

## 2022-09-21 MED ORDER — SODIUM CHLORIDE 0.9 % IV SOLN: Freq: Once | INTRAVENOUS | Status: AC

## 2022-09-21 MED ORDER — LORATADINE 10 MG PO TABS
10.0000 mg | ORAL_TABLET | Freq: Once | ORAL | Status: AC
  Administered 2022-09-21: 10 mg via ORAL
  Filled 2022-09-21: qty 1

## 2022-09-21 MED ORDER — SODIUM CHLORIDE 0.9 % IV SOLN
200.0000 mg | Freq: Once | INTRAVENOUS | Status: AC
  Administered 2022-09-21: 200 mg via INTRAVENOUS
  Filled 2022-09-21: qty 200

## 2022-09-21 MED ORDER — ACETAMINOPHEN 325 MG PO TABS
650.0000 mg | ORAL_TABLET | Freq: Once | ORAL | Status: AC
  Administered 2022-09-21: 650 mg via ORAL
  Filled 2022-09-21: qty 2

## 2022-09-21 NOTE — Patient Instructions (Signed)

## 2022-09-21 NOTE — Progress Notes (Signed)
Pt observed for 30 minutes post venofer infusion. Tolerated well. No adverse SS. VSS at time of discharge. AVS reviewed, ambulated independently to lobby.

## 2022-09-28 ENCOUNTER — Inpatient Hospital Stay: Payer: Medicaid Other

## 2022-09-28 ENCOUNTER — Other Ambulatory Visit: Payer: Self-pay

## 2022-09-28 VITALS — BP 118/62 | HR 78 | Temp 98.2°F | Resp 18

## 2022-09-28 DIAGNOSIS — D509 Iron deficiency anemia, unspecified: Secondary | ICD-10-CM | POA: Diagnosis not present

## 2022-09-28 DIAGNOSIS — D500 Iron deficiency anemia secondary to blood loss (chronic): Secondary | ICD-10-CM

## 2022-09-28 MED ORDER — LORATADINE 10 MG PO TABS
10.0000 mg | ORAL_TABLET | Freq: Once | ORAL | Status: AC
  Administered 2022-09-28: 10 mg via ORAL
  Filled 2022-09-28: qty 1

## 2022-09-28 MED ORDER — SODIUM CHLORIDE 0.9 % IV SOLN: Freq: Once | INTRAVENOUS | Status: AC

## 2022-09-28 MED ORDER — ACETAMINOPHEN 325 MG PO TABS
650.0000 mg | ORAL_TABLET | Freq: Once | ORAL | Status: AC
  Administered 2022-09-28: 650 mg via ORAL
  Filled 2022-09-28: qty 2

## 2022-09-28 MED ORDER — SODIUM CHLORIDE 0.9 % IV SOLN
200.0000 mg | Freq: Once | INTRAVENOUS | Status: AC
  Administered 2022-09-28: 200 mg via INTRAVENOUS
  Filled 2022-09-28: qty 200

## 2022-09-28 NOTE — Patient Instructions (Signed)

## 2022-09-28 NOTE — Progress Notes (Signed)
Patient stated that she did fine last treatment and is declining her 30 minute post iron infusion. Vss upon discharge ambulatory to the lobby

## 2022-10-07 ENCOUNTER — Encounter: Payer: Self-pay | Admitting: Oncology

## 2022-10-07 DIAGNOSIS — N938 Other specified abnormal uterine and vaginal bleeding: Secondary | ICD-10-CM | POA: Insufficient documentation

## 2022-10-16 ENCOUNTER — Encounter: Payer: Self-pay | Admitting: Gastroenterology

## 2022-10-16 ENCOUNTER — Ambulatory Visit (INDEPENDENT_AMBULATORY_CARE_PROVIDER_SITE_OTHER): Payer: Medicaid Other | Admitting: Gastroenterology

## 2022-10-16 VITALS — BP 94/62 | HR 77 | Ht <= 58 in | Wt 195.1 lb

## 2022-10-16 DIAGNOSIS — K9089 Other intestinal malabsorption: Secondary | ICD-10-CM

## 2022-10-16 DIAGNOSIS — K219 Gastro-esophageal reflux disease without esophagitis: Secondary | ICD-10-CM

## 2022-10-16 DIAGNOSIS — D509 Iron deficiency anemia, unspecified: Secondary | ICD-10-CM

## 2022-10-16 NOTE — Progress Notes (Signed)
10/16/2022 Avion Mullenax [id number] Jul 13, 1990   HISTORY OF PRESENT ILLNESS: This is a 32 year old female whose care has been assigned to Dr. Tomasa Rand.  I saw her in March and then again in April.  She was having issues with chronic diarrhea as well as nausea and epigastric/upper abdominal pain.  She had been found to be iron deficient (had this issue in the past as well and required IV iron infusions previously).  In regards to her diarrhea she is doing much better on Questran 1 packet daily.  For her upper GI symptoms we discontinued her pantoprazole and switched her to Dexilant 30 mg.  I had recommended she do this twice daily and do Pepcid at bedtime.  She says that she has only been doing it once daily and doing the Pepcid.  She feels like this is working better and is doing fine with that.  She was referred to hematology for iron deficiency anemia and she has been again receiving IV iron infusions.  She has not yet re-established with ENT.   Past Medical History:  Diagnosis Date   Anemia    Appendicitis    Dyspnea    with exertion   GERD (gastroesophageal reflux disease)    Headache    Hearing loss    bilateral   History of kidney stones    Narrowing of airway    Obesity    Past Surgical History:  Procedure Laterality Date   APPENDECTOMY     CHOLECYSTECTOMY     LUNG BIOPSY     MICROLARYNGOSCOPY WITH LASER AND BALLOON DILATION N/A 07/04/2016   Procedure: MICROLARYNGOSCOPY WITH LASER AND BALLOON DILATION;  Surgeon: Serena Colonel, MD;  Location: [facility] OR;  Service: ENT;  Laterality: N/A;   TONSILLECTOMY AND ADENOIDECTOMY     TRACHEOSTOMY      reports that she has never smoked. She has never used smokeless tobacco. She reports current alcohol use. She reports that she does not use drugs. family history includes Anemia in her brother and mother; Asthma in her brother and mother; Hyperlipidemia in her maternal grandfather; Hypertension in her maternal grandmother. Allergies   Allergen Reactions   Celexa [Citalopram] Other (See Comments)    Felt more depressed      Outpatient Encounter Medications as of 10/16/2022  Medication Sig   ACCRUFER 30 MG CAPS Take 1 capsule by mouth 2 (two) times daily.   albuterol (PROVENTIL) (2.5 MG/3ML) 0.083% nebulizer solution Take 2.5 mg by nebulization as needed.   albuterol (VENTOLIN HFA) 108 (90 Base) MCG/ACT inhaler Inhale 1 puff into the lungs as needed.   cholestyramine (QUESTRAN) 4 g packet Take 1 packet (4 g total) by mouth daily.   Dexlansoprazole (DEXILANT) 30 MG capsule DR Take 1 capsule (30 mg total) by mouth in the morning and at bedtime.   famotidine (PEPCID) 20 MG tablet Take 20 mg by mouth 2 (two) times daily.   ondansetron (ZOFRAN-ODT) 8 MG disintegrating tablet Take 8 mg by mouth 3 (three) times daily.   Budesonide 90 MCG/ACT inhaler Inhale 1 puff into the lungs 2 (two) times daily as needed (shortness of breath). (Patient not taking: Reported on 10/16/2022)   [DISCONTINUED] Eluxadoline (VIBERZI) 100 MG TABS Take 100 mg by mouth 2 (two) times daily.   [DISCONTINUED] Prenatal Vit-Fe Fumarate-FA (PRENATAL COMPLETE) 14-0.4 MG TABS Take 1 tablet by mouth daily.   No facility-administered encounter medications on file as of 10/16/2022.     REVIEW OF SYSTEMS  : All other  systems reviewed and negative except where noted in the History of Present Illness.   PHYSICAL EXAM: BP 94/62   Pulse 77   Ht 4\' 10"  (1.473 m)   Wt 195 lb 2 oz (88.5 kg)   BMI 40.78 kg/m  General: Well developed female in no acute distress Head: Normocephalic and atraumatic Eyes:  Sclerae anicteric, conjunctiva pink. Ears: Normal auditory acuity Lungs: Clear throughout to auscultation; no W/R/R. Heart: Regular rate and rhythm; no M/R/G. Musculoskeletal: Symmetrical with no gross deformities  Skin: No lesions on visible extremities Extremities: No edema  Neurological: Alert oriented x 4, grossly non-focal Psychological:  Alert and  cooperative. Normal mood and affect  ASSESSMENT AND PLAN: *Diarrhea: Chronic for years, suspected bile salt related due to not having a gallbladder.  Much improved on Questran 1 packet daily.  Will continue. *Nausea and epigastric abdominal pain: This is also chronic for years.  She is on pantoprazole 40 mg twice daily.  Celiac and H. pylori testing previously negative.  Upper GI study shows a tiny hiatal hernia and active reflux despite the pantoprazole 40 mg twice daily.  We changed PPI from pantoprazole to Dexilant.  Plan was for 30 mg twice daily, but she has only been doing it once daily and is doing well with that.  Also doing famotidine at bedtime.  Can increase the Dexilant to twice daily or increase the dose to 60 mg if needed. *Iron deficiency anemia: This has been an issue for years as well.  No evidence of overt GI bleeding.  She historically had heavy periods.  Has history of requiring IV iron infusions.  Supposed be on oral iron, but has trouble swallowing pills and forgets to take it as well.  Was referred to hematology again has been getting IV iron infusions.   **She still has not seen ENT.  If any need for procedures in the future they will need to be done at the hospital due to difficult airway and would like clearance from ENT due to her complicated issue with narrow upper airway, previous trach, etc. She says that at [facility] does not accept her insurance.  Dr. Ezzard Standing is retired.  Dr. Suszanne Conners is no longer doing adult throat patients.  She will need to contact her insurance to see who is in her network locally    CC:  Tracey Harries, MD

## 2022-10-16 NOTE — Patient Instructions (Signed)
Follow up in 6 months or sooner if needed.  _______________________________________________________  If your blood pressure at your visit was 140/90 or greater, please contact your primary care physician to follow up on this.  _______________________________________________________  If you are age 30 or older, your body mass index should be between 23-30. Your Body mass index is 40.78 kg/m. If this is out of the aforementioned range listed, please consider follow up with your Primary Care Provider.  If you are age 25 or younger, your body mass index should be between 19-25. Your Body mass index is 40.78 kg/m. If this is out of the aformentioned range listed, please consider follow up with your Primary Care Provider.   ________________________________________________________  The [facility] providers would like to encourage you to use [facility] to communicate with providers for non-urgent requests or questions.  Due to long hold times on the telephone, sending your provider a message by [facility] may be a faster and more efficient way to get a response.  Please allow 48 business hours for a response.  Please remember that this is for non-urgent requests.  _______________________________________________________

## 2022-10-17 NOTE — Progress Notes (Signed)
Agree with the assessment and plan as outlined by Doug Sou, PA-C.  Continue to defer endoscopy given chronicity of anemia, likelihood of menorrhagic source, and potential for airway complications during sedation given history of tracheal stenosis.  ENT follow to reassess airway and provide risk assessment for elective sedated procedure would be helpful.  Chloey Ricard E. Tomasa Rand, MD  [facility]

## 2022-10-26 ENCOUNTER — Other Ambulatory Visit: Payer: Self-pay

## 2022-10-26 ENCOUNTER — Inpatient Hospital Stay: Payer: Medicaid Other | Attending: Oncology

## 2022-10-26 VITALS — BP 126/74 | HR 74 | Temp 98.2°F | Resp 18

## 2022-10-26 DIAGNOSIS — D509 Iron deficiency anemia, unspecified: Secondary | ICD-10-CM | POA: Insufficient documentation

## 2022-10-26 DIAGNOSIS — D500 Iron deficiency anemia secondary to blood loss (chronic): Secondary | ICD-10-CM

## 2022-10-26 MED ORDER — LORATADINE 10 MG PO TABS
10.0000 mg | ORAL_TABLET | Freq: Once | ORAL | Status: AC
  Administered 2022-10-26: 10 mg via ORAL
  Filled 2022-10-26: qty 1

## 2022-10-26 MED ORDER — SODIUM CHLORIDE 0.9 % IV SOLN
200.0000 mg | Freq: Once | INTRAVENOUS | Status: AC
  Administered 2022-10-26: 200 mg via INTRAVENOUS
  Filled 2022-10-26: qty 200

## 2022-10-26 MED ORDER — SODIUM CHLORIDE 0.9 % IV SOLN: Freq: Once | INTRAVENOUS | Status: AC

## 2022-10-26 MED ORDER — ACETAMINOPHEN 325 MG PO TABS
650.0000 mg | ORAL_TABLET | Freq: Once | ORAL | Status: AC
  Administered 2022-10-26: 650 mg via ORAL
  Filled 2022-10-26: qty 2

## 2022-10-26 NOTE — Patient Instructions (Signed)

## 2022-10-26 NOTE — Progress Notes (Signed)
Patient declined 30 min post observation.  Tolerated treatment well without incident.  VSS at discharge.  Ambulated to lobby. 

## 2022-11-02 ENCOUNTER — Inpatient Hospital Stay: Payer: Medicaid Other | Admitting: Oncology

## 2022-11-02 ENCOUNTER — Inpatient Hospital Stay: Payer: Medicaid Other

## 2022-11-09 ENCOUNTER — Inpatient Hospital Stay: Payer: Medicaid Other

## 2022-11-28 NOTE — Progress Notes (Signed)
[facility] Cancer Initial Visit:  Patient Care Team: Tracey Harries, MD as PCP - General (Family Medicine)  CHIEF COMPLAINTS/PURPOSE OF CONSULTATION:  HISTORY OF PRESENTING ILLNESS: Karen Mcdaniel 32 y.o. female is here because of anemia Medical history is notable for appendicitis, GERD, bilateral hearing loss, nephrolithiasis, obesity, chronic nausea and abdominal pain, tracheal narrowing requiring periodic dilation  July 24, 2022: Upper GI showed mild spontaneous GERD  August 17, 2022: WBC 9.6 hemoglobin 9.1 MCV 55 platelet count 302; 67 segs 20 lymphs 6 monos 3 eos 1 basophil. Ferritin 8 B12 318 folate 9.8  Sep 14 2022:  [facility] Hematology Consult Patient states that she has been anemic almost all of her life.    Patient is G1 P1.  Menopause not reached.  Menses occur monthly and last 7 days and are regular.  Bleeding varies between normal to heavy.  Does not have bleeding between periods.    Does pass clots Last pregnancy was delivered in April 2022 by NSVD.  Patient does not have history of uterine fibroids/uterine abnormalities.  Has taken oral iron/ received IV iron but has not required PRBC's in the past.   Has not tolerated oral iron owing to GI intolerance.  No reaction to IV iron.  Has a normal diet  No history of postpartum hemorrhage requiring transfusion.  No history of hemorrhage postoperatively requiring transfusion.     No hematochezia, melena, hemoptysis, hematuria.  No history of intra-articular or soft tissue bleeding.  No history of abnormal bleeding in family members.  Patient has symptoms of fatigue, pallor,  DOE, decreased performance status, cold intolerance.  Patient has pica ice but not to starch/dirt.    Social:  Single.  Works as a Copywriter, advertising.  Tobacco none.  EtOH only on special occasions.    Hemoglobin electrophoresis normal adult pattern. Factor VIII 104 von Willebrand factor antigen 140 ristocetin cofactor 79 INR 1.1 PTT 30 fibrinogen  534 Copper 133 zinc 77  Sep 21, 2022 through October 26, 2022: Received total of 600 mg of Venofer  November 15 2022:  GI follow up.    November 30, 2022: Scheduled follow-up for anemia.  Feels ok.  GI unable to scope patient due to narrow airway.  Has not seen Gynecology since last visit.  Patient states that her two year old son was recently diagnosed with iron deficiency anemia.   Begin low dose oral iron.  Refer to Gynecology.  Patient has not yet seen ENT due to insurance issues.   To receive venofer 200 mg daily Hgb 10.5 MCV 61 Ferritin 46  Review of Systems - Oncology  MEDICAL HISTORY: Past Medical History:  Diagnosis Date   Anemia    Appendicitis    Dyspnea    with exertion   GERD (gastroesophageal reflux disease)    Headache    Hearing loss    bilateral   History of kidney stones    Narrowing of airway    Obesity     SURGICAL HISTORY: Past Surgical History:  Procedure Laterality Date   APPENDECTOMY     CHOLECYSTECTOMY     LUNG BIOPSY     MICROLARYNGOSCOPY WITH LASER AND BALLOON DILATION N/A 07/04/2016   Procedure: MICROLARYNGOSCOPY WITH LASER AND BALLOON DILATION;  Surgeon: Serena Colonel, MD;  Location: [facility] OR;  Service: ENT;  Laterality: N/A;   TONSILLECTOMY AND ADENOIDECTOMY     TRACHEOSTOMY      SOCIAL HISTORY: Social History   Socioeconomic History   Marital status: Single  Spouse name: Not on file   Number of children: 1   Years of education: Not on file   Highest education level: Not on file  Occupational History   Occupation: self employed  Tobacco Use   Smoking status: Never   Smokeless tobacco: Never  Vaping Use   Vaping status: Never Used  Substance and Sexual Activity   Alcohol use: Yes    Comment: occasional   Drug use: No   Sexual activity: Yes  Other Topics Concern   Not on file  Social History Narrative   Not on file   Social Determinants of Health   Financial Resource Strain: Not on file  Food Insecurity: No Food Insecurity  (09/14/2022)   Hunger Vital Sign    Worried About Running Out of Food in the Last Year: Never true    Ran Out of Food in the Last Year: Never true  Transportation Needs: No Transportation Needs (09/14/2022)   PRAPARE - Administrator, Civil Service (Medical): No    Lack of Transportation (Non-Medical): No  Physical Activity: Not on file  Stress: Not on file  Social Connections: Unknown (09/06/2021)   Received from [facility]   Social Network    Social Network: Not on file  Intimate Partner Violence: Not At Risk (09/14/2022)   Humiliation, Afraid, Rape, and Kick questionnaire    Fear of Current or Ex-Partner: No    Emotionally Abused: No    Physically Abused: No    Sexually Abused: No    FAMILY HISTORY Family History  Problem Relation Age of Onset   Asthma Mother    Anemia Mother    Asthma Brother    Anemia Brother    Hypertension Maternal Grandmother    Hyperlipidemia Maternal Grandfather     ALLERGIES:  is allergic to celexa [citalopram].  MEDICATIONS:  Current Outpatient Medications  Medication Sig Dispense Refill   albuterol (PROVENTIL) (2.5 MG/3ML) 0.083% nebulizer solution Take 2.5 mg by nebulization as needed.     albuterol (VENTOLIN HFA) 108 (90 Base) MCG/ACT inhaler Inhale 1 puff into the lungs as needed.     Budesonide 90 MCG/ACT inhaler Inhale 1 puff into the lungs 2 (two) times daily as needed (shortness of breath).     cholestyramine (QUESTRAN) 4 g packet Take 1 packet (4 g total) by mouth daily. 30 each 2   Dexlansoprazole (DEXILANT) 30 MG capsule DR Take 1 capsule (30 mg total) by mouth in the morning and at bedtime. 60 capsule 3   famotidine (PEPCID) 20 MG tablet Take 20 mg by mouth 2 (two) times daily.     ondansetron (ZOFRAN-ODT) 8 MG disintegrating tablet Take 8 mg by mouth 3 (three) times daily.     No current facility-administered medications for this visit.    PHYSICAL EXAMINATION:  ECOG PERFORMANCE STATUS: 1 -  Symptomatic but completely ambulatory   Vitals:   11/30/22 1136  BP: 129/77  Pulse: 76  Resp: 18  Temp: (!) 97.5 F (36.4 C)  SpO2: 95%     Filed Weights   11/30/22 1136  Weight: 198 lb 4.8 oz (89.9 kg)      Physical Exam Vitals and nursing note reviewed.  Constitutional:      General: She is not in acute distress.    Appearance: Normal appearance. She is obese. She is not ill-appearing, toxic-appearing or diaphoretic.     Comments: Here alone.    HENT:     Head: Normocephalic and atraumatic.  Right Ear: External ear normal.     Left Ear: External ear normal.     Nose: Nose normal. No congestion or rhinorrhea.  Eyes:     General: No scleral icterus.    Extraocular Movements: Extraocular movements intact.     Conjunctiva/sclera: Conjunctivae normal.     Pupils: Pupils are equal, round, and reactive to light.  Neck:     Comments: Tracheostomy scar Cardiovascular:     Rate and Rhythm: Normal rate.     Heart sounds: No murmur heard.    No friction rub. No gallop.  Pulmonary:     Effort: Pulmonary effort is normal. No respiratory distress.     Breath sounds: Normal breath sounds. No stridor. No wheezing or rhonchi.  Abdominal:     General: Bowel sounds are normal.     Palpations: Abdomen is soft.     Tenderness: There is no abdominal tenderness. There is no guarding or rebound.  Musculoskeletal:        General: No swelling, tenderness or deformity.     Cervical back: Normal range of motion and neck supple. No rigidity or tenderness.     Right lower leg: No edema.     Left lower leg: No edema.  Lymphadenopathy:     Head:     Right side of head: No submental, submandibular, tonsillar, preauricular, posterior auricular or occipital adenopathy.     Left side of head: No submental, submandibular, tonsillar, preauricular, posterior auricular or occipital adenopathy.     Cervical: No cervical adenopathy.     Right cervical: No superficial, deep or posterior cervical  adenopathy.    Left cervical: No superficial, deep or posterior cervical adenopathy.     Upper Body:     Right upper body: No supraclavicular, axillary, pectoral or epitrochlear adenopathy.     Left upper body: No supraclavicular, axillary, pectoral or epitrochlear adenopathy.  Skin:    General: Skin is warm.     Coloration: Skin is not jaundiced.  Neurological:     General: No focal deficit present.     Mental Status: She is alert and oriented to person, place, and time.     Cranial Nerves: Cranial nerve deficit present.     Comments: Has bilateral hearing aids  Psychiatric:        Mood and Affect: Mood normal.        Behavior: Behavior normal.        Thought Content: Thought content normal.        Judgment: Judgment normal.      LABORATORY DATA: I have personally reviewed the data as listed:  Appointment on 11/30/2022  Component Date Value Ref Range Status   Ferritin 11/30/2022 46  11 - 307 ng/mL Final   Performed at [facility], [address], [place], [place] [address]   WBC Count 11/30/2022 8.4  4.0 - 10.5 K/uL Final   RBC 11/30/2022 5.55 (H)  3.87 - 5.11 MIL/uL Final   Hemoglobin 11/30/2022 10.5 (L)  12.0 - 15.0 g/dL Final   Comment: Reticulocyte Hemoglobin testing may be clinically indicated, consider ordering this additional test [id number]    HCT 11/30/2022 34.1 (L)  36.0 - 46.0 % Final   MCV 11/30/2022 61.4 (L)  80.0 - 100.0 fL Final   MCH 11/30/2022 18.9 (L)  26.0 - 34.0 pg Final   MCHC 11/30/2022 30.8  30.0 - 36.0 g/dL Final   RDW 11/91/4782 21.1 (H)  11.5 - 15.5 % Final   Platelet  Count 11/30/2022 309  150 - 400 K/uL Final   nRBC 11/30/2022 0.0  0.0 - 0.2 % Final   Neutrophils Relative % 11/30/2022 71  % Final   Neutro Abs 11/30/2022 6.0  1.7 - 7.7 K/uL Final   Lymphocytes Relative 11/30/2022 20  % Final   Lymphs Abs 11/30/2022 1.7  0.7 - 4.0 K/uL Final   Monocytes Relative 11/30/2022 6  % Final   Monocytes Absolute 11/30/2022 0.5  0.1  - 1.0 K/uL Final   Eosinophils Relative 11/30/2022 2  % Final   Eosinophils Absolute 11/30/2022 0.2  0.0 - 0.5 K/uL Final   Basophils Relative 11/30/2022 0  % Final   Basophils Absolute 11/30/2022 0.0  0.0 - 0.1 K/uL Final   Immature Granulocytes 11/30/2022 1  % Final   Abs Immature Granulocytes 11/30/2022 0.04  0.00 - 0.07 K/uL Final   Performed at [facility], [address] [place], [place] [address]    RADIOGRAPHIC STUDIES: I have personally reviewed the radiological images as listed and agree with the findings in the report  No results found.  ASSESSMENT/PLAN  Patient is a 32 year old female with symptomatic microcytic anemia presumed to be secondary to dysfunctional uterine bleeding.  Medical history is notable for appendicitis, GERD, bilateral hearing loss, nephrolithiasis, obesity, chronic nausea and abdominal pain, tracheal narrowing requiring periodic dilation    Anemia:  Etiology is iron deficiency anemia owing to 1) dysfunctional uterine bleeding 2) high demand from prior pregnancy 3) possible GI blood loss Sep 21, 2022 through October 26, 2022: Received total of 600 mg of Venofer  November 30 2022- Hgb 10.5 MCV 61 Ferritin 46.  For Venofer 200 mg IV today.  GI unable to scope patient due to narrowed upper airway.  Has not seen ENT due to insurance reasons  Dysfunctional uterine bleeding:  This is characterized by menses that are prolonged, irregular as well as by heavy bleeding with passage of clots.   Sep 14 2022-- PT, PTT, Fibrinogen, von Willebrand screen normal.  Referred to gynecology  November 30 2022- Has not yet seen gynecology    Cancer Staging  No matching staging information was found for the patient.    No problem-specific Assessment & Plan notes found for this encounter.    Orders Placed This Encounter  Procedures   Ambulatory referral to Gynecology    Referral Priority:   Routine    Referral Type:   Consultation    Referral Reason:    Specialty Services Required    Requested Specialty:   Gynecology    Number of Visits Requested:   1    30  minutes was spent in patient care.  This included time spent preparing to see the patient (e.g., review of tests), obtaining and/or reviewing separately obtained history, counseling and educating the patient/family/caregiver, ordering medications, tests, or procedures; documenting clinical information in the electronic or other health record, independently interpreting results and communicating results to the patient/family/caregiver as well as coordination of care.       All questions were answered. The patient knows to call the clinic with any problems, questions or concerns.  This note was electronically signed.    Loni Muse, MD  12/14/2022 4:55 PM

## 2022-11-29 ENCOUNTER — Other Ambulatory Visit: Payer: Self-pay | Admitting: Oncology

## 2022-11-29 DIAGNOSIS — D500 Iron deficiency anemia secondary to blood loss (chronic): Secondary | ICD-10-CM

## 2022-11-30 ENCOUNTER — Other Ambulatory Visit: Payer: Self-pay

## 2022-11-30 ENCOUNTER — Inpatient Hospital Stay: Payer: Medicaid Other

## 2022-11-30 ENCOUNTER — Inpatient Hospital Stay: Payer: Medicaid Other | Attending: Oncology

## 2022-11-30 ENCOUNTER — Telehealth: Payer: Self-pay | Admitting: Oncology

## 2022-11-30 ENCOUNTER — Inpatient Hospital Stay (HOSPITAL_BASED_OUTPATIENT_CLINIC_OR_DEPARTMENT_OTHER): Payer: Medicaid Other | Admitting: Oncology

## 2022-11-30 VITALS — BP 129/77 | HR 76 | Temp 97.5°F | Resp 18 | Wt 198.3 lb

## 2022-11-30 VITALS — BP 111/77 | HR 81 | Resp 18

## 2022-11-30 DIAGNOSIS — N938 Other specified abnormal uterine and vaginal bleeding: Secondary | ICD-10-CM

## 2022-11-30 DIAGNOSIS — Q310 Web of larynx: Secondary | ICD-10-CM | POA: Diagnosis not present

## 2022-11-30 DIAGNOSIS — K219 Gastro-esophageal reflux disease without esophagitis: Secondary | ICD-10-CM | POA: Diagnosis not present

## 2022-11-30 DIAGNOSIS — Z9889 Other specified postprocedural states: Secondary | ICD-10-CM | POA: Diagnosis not present

## 2022-11-30 DIAGNOSIS — Z79899 Other long term (current) drug therapy: Secondary | ICD-10-CM | POA: Insufficient documentation

## 2022-11-30 DIAGNOSIS — H9193 Unspecified hearing loss, bilateral: Secondary | ICD-10-CM | POA: Insufficient documentation

## 2022-11-30 DIAGNOSIS — D500 Iron deficiency anemia secondary to blood loss (chronic): Secondary | ICD-10-CM

## 2022-11-30 DIAGNOSIS — D509 Iron deficiency anemia, unspecified: Secondary | ICD-10-CM | POA: Insufficient documentation

## 2022-11-30 LAB — CBC WITH DIFFERENTIAL (CANCER CENTER ONLY)
Abs Immature Granulocytes: 0.04 10*3/uL (ref 0.00–0.07)
Basophils Absolute: 0 10*3/uL (ref 0.0–0.1)
Basophils Relative: 0 %
Eosinophils Absolute: 0.2 10*3/uL (ref 0.0–0.5)
Eosinophils Relative: 2 %
HCT: 34.1 % — ABNORMAL LOW (ref 36.0–46.0)
Hemoglobin: 10.5 g/dL — ABNORMAL LOW (ref 12.0–15.0)
Immature Granulocytes: 1 %
Lymphocytes Relative: 20 %
Lymphs Abs: 1.7 10*3/uL (ref 0.7–4.0)
MCH: 18.9 pg — ABNORMAL LOW (ref 26.0–34.0)
MCHC: 30.8 g/dL (ref 30.0–36.0)
MCV: 61.4 fL — ABNORMAL LOW (ref 80.0–100.0)
Monocytes Absolute: 0.5 10*3/uL (ref 0.1–1.0)
Monocytes Relative: 6 %
Neutro Abs: 6 10*3/uL (ref 1.7–7.7)
Neutrophils Relative %: 71 %
Platelet Count: 309 10*3/uL (ref 150–400)
RBC: 5.55 MIL/uL — ABNORMAL HIGH (ref 3.87–5.11)
RDW: 21.1 % — ABNORMAL HIGH (ref 11.5–15.5)
WBC Count: 8.4 10*3/uL (ref 4.0–10.5)
nRBC: 0 % (ref 0.0–0.2)

## 2022-11-30 MED ORDER — ACETAMINOPHEN 325 MG PO TABS
650.0000 mg | ORAL_TABLET | Freq: Once | ORAL | Status: AC
  Administered 2022-11-30: 650 mg via ORAL
  Filled 2022-11-30: qty 2

## 2022-11-30 MED ORDER — SODIUM CHLORIDE 0.9 % IV SOLN: Freq: Once | INTRAVENOUS | Status: AC

## 2022-11-30 MED ORDER — LORATADINE 10 MG PO TABS
10.0000 mg | ORAL_TABLET | Freq: Once | ORAL | Status: AC
  Administered 2022-11-30: 10 mg via ORAL
  Filled 2022-11-30: qty 1

## 2022-11-30 MED ORDER — SODIUM CHLORIDE 0.9 % IV SOLN
200.0000 mg | Freq: Once | INTRAVENOUS | Status: AC
  Administered 2022-11-30: 200 mg via INTRAVENOUS
  Filled 2022-11-30: qty 200

## 2022-11-30 NOTE — Telephone Encounter (Signed)
Patient is aware of upcoming appointment times/dates.  

## 2022-11-30 NOTE — Progress Notes (Signed)
Pt declined 30 minute observation period post venofer infusion. VSS. No complaints at time of discharge.  

## 2022-11-30 NOTE — Patient Instructions (Addendum)
Iron Sucrose Injection What is this medication? IRON SUCROSE (EYE ern SOO krose) treats low levels of iron (iron deficiency anemia) in people with kidney disease. Iron is a mineral that plays an important role in making red blood cells, which carry oxygen from your lungs to the rest of your body. This medicine may be used for other purposes; ask your health care provider or pharmacist if you have questions. COMMON BRAND NAME(S): Venofer What should I tell my care team before I take this medication? They need to know if you have any of these conditions: Anemia not caused by low iron levels Heart disease High levels of iron in the blood Kidney disease Liver disease An unusual or allergic reaction to iron, other medications, foods, dyes, or preservatives Pregnant or trying to get pregnant Breastfeeding How should I use this medication? This medication is for infusion into a vein. It is given in a hospital or clinic setting. Talk to your care team about the use of this medication in children. While this medication may be prescribed for children as young as 2 years for selected conditions, precautions do apply. Overdosage: If you think you have taken too much of this medicine contact a poison control center or emergency room at once. NOTE: This medicine is only for you. Do not share this medicine with others. What if I miss a dose? Keep appointments for follow-up doses. It is important not to miss your dose. Call your care team if you are unable to keep an appointment. What may interact with this medication? Do not take this medication with any of the following: Deferoxamine Dimercaprol Other iron products This medication may also interact with the following: Chloramphenicol Deferasirox This list may not describe all possible interactions. Give your health care provider a list of all the medicines, herbs, non-prescription drugs, or dietary supplements you use. Also tell them if you smoke,  drink alcohol, or use illegal drugs. Some items may interact with your medicine. What should I watch for while using this medication? Visit your care team regularly. Tell your care team if your symptoms do not start to get better or if they get worse. You may need blood work done while you are taking this medication. You may need to follow a special diet. Talk to your care team. Foods that contain iron include: whole grains/cereals, dried fruits, beans, or peas, leafy green vegetables, and organ meats (liver, kidney). What side effects may I notice from receiving this medication? Side effects that you should report to your care team as soon as possible: Allergic reactions--skin rash, itching, hives, swelling of the face, lips, tongue, or throat Low blood pressure--dizziness, feeling faint or lightheaded, blurry vision Shortness of breath Side effects that usually do not require medical attention (report to your care team if they continue or are bothersome): Flushing Headache Joint pain Muscle pain Nausea Pain, redness, or irritation at injection site This list may not describe all possible side effects. Call your doctor for medical advice about side effects. You may report side effects to FDA at 1-800-FDA-1088. Where should I keep my medication? This medication is given in a hospital or clinic. It will not be stored at home. NOTE: This sheet is a summary. It may not cover all possible information. If you have questions about this medicine, talk to your doctor, pharmacist, or health care provider.  2024 Elsevier/Gold Standard (2022-09-28 00:00:00)

## 2022-11-30 NOTE — Patient Instructions (Signed)
Please begin a children's vitamin with iron or a women's vitamin with iron daily

## 2022-12-06 ENCOUNTER — Telehealth: Payer: Self-pay | Admitting: Oncology

## 2022-12-07 ENCOUNTER — Inpatient Hospital Stay: Payer: Medicaid Other

## 2022-12-14 ENCOUNTER — Encounter: Payer: Self-pay | Admitting: Oncology

## 2022-12-21 ENCOUNTER — Other Ambulatory Visit: Payer: Self-pay

## 2022-12-21 ENCOUNTER — Inpatient Hospital Stay: Payer: Medicaid Other | Attending: Oncology

## 2022-12-21 VITALS — BP 120/77 | HR 75 | Temp 97.6°F | Resp 18

## 2022-12-21 DIAGNOSIS — D509 Iron deficiency anemia, unspecified: Secondary | ICD-10-CM | POA: Insufficient documentation

## 2022-12-21 DIAGNOSIS — D500 Iron deficiency anemia secondary to blood loss (chronic): Secondary | ICD-10-CM

## 2022-12-21 DIAGNOSIS — N938 Other specified abnormal uterine and vaginal bleeding: Secondary | ICD-10-CM | POA: Insufficient documentation

## 2022-12-21 MED ORDER — SODIUM CHLORIDE 0.9 % IV SOLN
200.0000 mg | Freq: Once | INTRAVENOUS | Status: AC
  Administered 2022-12-21: 200 mg via INTRAVENOUS
  Filled 2022-12-21: qty 200

## 2022-12-21 MED ORDER — SODIUM CHLORIDE 0.9 % IV SOLN: Freq: Once | INTRAVENOUS | Status: AC

## 2022-12-21 MED ORDER — LORATADINE 10 MG PO TABS
10.0000 mg | ORAL_TABLET | Freq: Once | ORAL | Status: AC
  Administered 2022-12-21: 10 mg via ORAL
  Filled 2022-12-21: qty 1

## 2022-12-21 MED ORDER — ACETAMINOPHEN 325 MG PO TABS
650.0000 mg | ORAL_TABLET | Freq: Once | ORAL | Status: AC
  Administered 2022-12-21: 650 mg via ORAL
  Filled 2022-12-21: qty 2

## 2023-01-15 ENCOUNTER — Other Ambulatory Visit: Payer: Self-pay | Admitting: Gastroenterology

## 2023-01-25 ENCOUNTER — Inpatient Hospital Stay: Payer: Medicaid Other | Admitting: Oncology

## 2023-01-30 ENCOUNTER — Inpatient Hospital Stay: Payer: Medicaid Other | Attending: Oncology | Admitting: Nurse Practitioner

## 2023-01-30 ENCOUNTER — Other Ambulatory Visit: Payer: Self-pay | Admitting: Nurse Practitioner

## 2023-01-30 DIAGNOSIS — D500 Iron deficiency anemia secondary to blood loss (chronic): Secondary | ICD-10-CM

## 2023-01-30 NOTE — Progress Notes (Deleted)
Patient Care Team: Tracey Harries, MD as PCP - General (Family Medicine)   CHIEF COMPLAINT: Follow up IDA  CURRENT THERAPY: IV Venofer 200 mg PRN; intolerant to oral iron   INTERVAL HISTORY Ms. Kibe returns for follow up as scheduled. Last seen by Dr. Angelene Giovanni 11/30/22 for ongoing anemia management. She received IV iron weekly x2 in 09/2022 then monthly x3 since June.   ROS   Past Medical History:  Diagnosis Date   Anemia    Appendicitis    Dyspnea    with exertion   GERD (gastroesophageal reflux disease)    Headache    Hearing loss    bilateral   History of kidney stones    Narrowing of airway    Obesity      Past Surgical History:  Procedure Laterality Date   APPENDECTOMY     CHOLECYSTECTOMY     LUNG BIOPSY     MICROLARYNGOSCOPY WITH LASER AND BALLOON DILATION N/A 07/04/2016   Procedure: MICROLARYNGOSCOPY WITH LASER AND BALLOON DILATION;  Surgeon: Serena Colonel, MD;  Location: [facility] OR;  Service: ENT;  Laterality: N/A;   TONSILLECTOMY AND ADENOIDECTOMY     TRACHEOSTOMY       Outpatient Encounter Medications as of 01/30/2023  Medication Sig   albuterol (PROVENTIL) (2.5 MG/3ML) 0.083% nebulizer solution Take 2.5 mg by nebulization as needed.   albuterol (VENTOLIN HFA) 108 (90 Base) MCG/ACT inhaler Inhale 1 puff into the lungs as needed.   Budesonide 90 MCG/ACT inhaler Inhale 1 puff into the lungs 2 (two) times daily as needed (shortness of breath).   cholestyramine (QUESTRAN) 4 g packet MIX 1 PACKET AND DRINK BY MOUTH ONCE DAILY   Dexlansoprazole (DEXILANT) 30 MG capsule DR Take 1 capsule (30 mg total) by mouth in the morning and at bedtime.   famotidine (PEPCID) 20 MG tablet Take 20 mg by mouth 2 (two) times daily.   ondansetron (ZOFRAN-ODT) 8 MG disintegrating tablet Take 8 mg by mouth 3 (three) times daily.   No facility-administered encounter medications on file as of 01/30/2023.     There were no vitals filed for this visit. There is no height or weight on file  to calculate BMI.   PHYSICAL EXAM GENERAL:alert, no distress and comfortable SKIN: no rash  EYES: sclera clear NECK: without mass LYMPH:  no palpable cervical or supraclavicular lymphadenopathy  LUNGS: clear with normal breathing effort HEART: regular rate & rhythm, no lower extremity edema ABDOMEN: abdomen soft, non-tender and normal bowel sounds NEURO: alert & oriented x 3 with fluent speech, no focal motor/sensory deficits Breast exam:  PAC without erythema    CBC    Component Value Date/Time   WBC 8.4 11/30/2022 1118   WBC 9.6 08/17/2022 1217   RBC 5.55 (H) 11/30/2022 1118   HGB 10.5 (L) 11/30/2022 1118   HCT 34.1 (L) 11/30/2022 1118   PLT 309 11/30/2022 1118   MCV 61.4 (L) 11/30/2022 1118   MCH 18.9 (L) 11/30/2022 1118   MCHC 30.8 11/30/2022 1118   RDW 21.1 (H) 11/30/2022 1118   LYMPHSABS 1.7 11/30/2022 1118   MONOABS 0.5 11/30/2022 1118   EOSABS 0.2 11/30/2022 1118   BASOSABS 0.0 11/30/2022 1118     CMP     Component Value Date/Time   NA 140 07/02/2019 0007   K 3.6 07/02/2019 0007   CL 107 07/02/2019 0007   CO2 21 (L) 07/02/2019 0007   GLUCOSE 165 (H) 07/02/2019 0007   BUN 10 07/02/2019 0007  CREATININE 0.72 07/02/2019 0007   CALCIUM 8.7 (L) 07/02/2019 0007   PROT 7.8 07/02/2019 0007   ALBUMIN 3.9 07/02/2019 0007   AST 22 07/02/2019 0007   ALT 15 07/02/2019 0007   ALKPHOS 79 07/02/2019 0007   BILITOT 0.5 07/02/2019 0007   GFRNONAA >60 07/02/2019 0007   GFRAA >60 07/02/2019 0007     ASSESSMENT & PLAN: 32 yo female   IDA, secondary to menorrhagia and low iron absorption  Work up showed normal B12, folate, copper, zinc, hgb electrophoresis, and normal Von Willebrand panel -Lab evidence of IDA, ferritin 7, serum iron 22, 5% saturation ratio -Began IV iron in 09/2022  PLAN:  No orders of the defined types were placed in this encounter.     All questions were answered. The patient knows to call the clinic with any problems, questions or concerns. No  barriers to learning were detected. I spent *** counseling the patient face to face. The total time spent in the appointment was *** and more than 50% was on counseling, review of test results, and coordination of care.   Santiago Glad, NP-C @DATE @

## 2023-04-01 ENCOUNTER — Other Ambulatory Visit: Payer: Self-pay | Admitting: Gastroenterology
